# Patient Record
Sex: Female | Born: 1965 | Race: Black or African American | Hispanic: No | Marital: Single | Smoking: Current every day smoker
Health system: Southern US, Community
[De-identification: ages and names within clinical notes are randomized; demographics above are authoritative.]

## PROBLEM LIST (undated history)

## (undated) DIAGNOSIS — B1910 Unspecified viral hepatitis B without hepatic coma: Secondary | ICD-10-CM

## (undated) DIAGNOSIS — I10 Essential (primary) hypertension: Secondary | ICD-10-CM

## (undated) HISTORY — PX: CHOLECYSTECTOMY: SHX55

## (undated) HISTORY — PX: ABDOMINAL HYSTERECTOMY: SHX81

---

## 2014-05-13 ENCOUNTER — Emergency Department (HOSPITAL_BASED_OUTPATIENT_CLINIC_OR_DEPARTMENT_OTHER): Payer: Self-pay

## 2014-05-13 ENCOUNTER — Emergency Department (HOSPITAL_BASED_OUTPATIENT_CLINIC_OR_DEPARTMENT_OTHER)
Admission: EM | Admit: 2014-05-13 | Discharge: 2014-05-13 | Disposition: A | Discharge status: Home / Self Care | Payer: Self-pay | Attending: Emergency Medicine | Admitting: Emergency Medicine

## 2014-05-13 ENCOUNTER — Encounter (HOSPITAL_BASED_OUTPATIENT_CLINIC_OR_DEPARTMENT_OTHER): Payer: Self-pay | Admitting: *Deleted

## 2014-05-13 DIAGNOSIS — J069 Acute upper respiratory infection, unspecified: Secondary | ICD-10-CM | POA: Insufficient documentation

## 2014-05-13 DIAGNOSIS — R110 Nausea: Secondary | ICD-10-CM | POA: Insufficient documentation

## 2014-05-13 DIAGNOSIS — Z90710 Acquired absence of both cervix and uterus: Secondary | ICD-10-CM | POA: Insufficient documentation

## 2014-05-13 DIAGNOSIS — N3000 Acute cystitis without hematuria: Secondary | ICD-10-CM | POA: Insufficient documentation

## 2014-05-13 DIAGNOSIS — Z720 Tobacco use: Secondary | ICD-10-CM | POA: Insufficient documentation

## 2014-05-13 LAB — URINE MICROSCOPIC-ADD ON

## 2014-05-13 LAB — URINALYSIS, ROUTINE W REFLEX MICROSCOPIC
Bilirubin Urine: NEGATIVE
GLUCOSE, UA: NEGATIVE mg/dL
HGB URINE DIPSTICK: NEGATIVE
Ketones, ur: NEGATIVE mg/dL
Nitrite: NEGATIVE
PH: 5.5 (ref 5.0–8.0)
Protein, ur: NEGATIVE mg/dL
SPECIFIC GRAVITY, URINE: 1.018 (ref 1.005–1.030)
UROBILINOGEN UA: 0.2 mg/dL (ref 0.0–1.0)

## 2014-05-13 MED ORDER — ONDANSETRON 4 MG PO TBDP: ORAL_TABLET | ORAL | Status: DC

## 2014-05-13 MED ORDER — CEPHALEXIN 500 MG PO CAPS: 500.0000 mg | ORAL_CAPSULE | Freq: Four times a day (QID) | ORAL | Status: DC

## 2014-05-13 NOTE — ED Provider Notes (Signed)
CSN: [id number]     Arrival date & time 05/13/14  2027 History  This chart was scribed for Theresa MoMatthew Fleur Audino, MD by Gwenyth Oberatherine Macek, ED Scribe. This patient was seen in room MH02/MH02 and the patient's care was started at 10:35 PM.    Chief Complaint  Patient presents with  . Cough  . Back Pain   The history is provided by the patient. No language interpreter was used.    HPI Comments: Theresa Galloway is a 49 y.o. female who presents to the Emergency Department complaining of intermittent productive cough with yellow sputum that started 4-5 days ago. She states subjective fever, lower back pain, urinary frequency and diarrhea as associated symptoms. Pt did not get her flu shot this year. She reports that her son has cold symptoms at home. Pt denies vaginal bleeding, vaginal discharge, abdominal pain, nausea, vomiting, constipation and dysuria as associated symptoms.  History reviewed. No pertinent past medical history. Past Surgical History  Procedure Laterality Date  . Abdominal hysterectomy     No family history on file. History  Substance Use Topics  . Smoking status: Current Every Day Smoker -- 0.50 packs/day    Types: Cigarettes  . Smokeless tobacco: Not on file  . Alcohol Use: No   OB History    No data available     Review of Systems  Constitutional: Positive for fever and chills.  Respiratory: Positive for cough.   Gastrointestinal: Positive for nausea. Negative for vomiting, diarrhea and constipation.  Genitourinary: Positive for frequency. Negative for dysuria, vaginal bleeding and vaginal discharge.  Musculoskeletal: Positive for back pain.  All other systems reviewed and are negative.  Allergies  Review of patient's allergies indicates no known allergies.  Home Medications   Prior to Admission medications   Medication Sig Start Date End Date Taking? Authorizing Provider  cephALEXin (KEFLEX) 500 MG capsule Take 1 capsule (500 mg total) by mouth 4 (four) times daily.  05/13/14   Theresa MoMatthew Baker Kogler, MD  ondansetron (ZOFRAN ODT) 4 MG disintegrating tablet 4mg  ODT q4 hours prn nausea/vomit 05/13/14   Theresa MoMatthew Katelan Hirt, MD   BP 159/108 mmHg  Pulse 81  Temp(Src) 98.3 F (36.8 C) (Oral)  Resp 20  Ht 5\' 3"  (1.6 m)  Wt 160 lb (72.576 kg)  BMI 28.35 kg/m2  SpO2 100% Physical Exam  Constitutional: She is oriented to person, place, and time. She appears well-developed and well-nourished.  HENT:  Head: Normocephalic and atraumatic.  Right Ear: External ear normal.  Left Ear: External ear normal.  Eyes: Conjunctivae and EOM are normal. Pupils are equal, round, and reactive to light.  Neck: Normal range of motion. Neck supple.  Cardiovascular: Normal rate, regular rhythm, normal heart sounds and intact distal pulses.   Pulmonary/Chest: Effort normal and breath sounds normal.  Abdominal: Soft. Bowel sounds are normal. There is no tenderness. There is CVA tenderness (L).  Musculoskeletal: Normal range of motion.  Neurological: She is alert and oriented to person, place, and time.  Skin: Skin is warm and dry.  Vitals reviewed.   ED Course  Procedures (including critical care time) DIAGNOSTIC STUDIES: Oxygen Saturation is 100% on RA, normal by my interpretation.    COORDINATION OF CARE: 10:34 PM Discussed treatment plan with pt at bedside and pt agreed to plan.  Labs Review Labs Reviewed  URINALYSIS, ROUTINE W REFLEX MICROSCOPIC - Abnormal; Notable for the following:    APPearance CLOUDY (*)    Leukocytes, UA MODERATE (*)    All other components  within normal limits  URINE MICROSCOPIC-ADD ON - Abnormal; Notable for the following:    Squamous Epithelial / LPF MANY (*)    Bacteria, UA MANY (*)    All other components within normal limits    Imaging Review Dg Chest 2 View  05/13/2014   CLINICAL DATA:  Cough and lower back pain for 4 days.  EXAM: CHEST  2 VIEW  COMPARISON:  None.  FINDINGS: Cardiac silhouette is upper limits of normal in size. Nipple shadows are  noted projecting over the lower lungs. There is mild peribronchial thickening bilaterally. No segmental airspace consolidation, pulmonary edema, pleural effusion, or pneumothorax is identified. No acute osseous abnormality is identified.  IMPRESSION: Peribronchial thickening, which may reflect bronchitis or reactive airways disease.   Electronically Signed   By: Sebastian Ache   On: 05/13/2014 21:00     EKG Interpretation None      MDM   Final diagnoses:  Upper respiratory infection  Acute cystitis without hematuria    48 y.o. female without pertinent PMH presents with cough, subj fevers, and bil lower lumbar pain x 1 week.  On arrival vitals and physical exam as above.  No dysuria, however pt has had pyelonephritis in the past without urinary symptoms.  She also endorses urinary frequency.  Will treat with keflex (pt uninsured), and fu PRN.  DC home with standard return precautions for pyelo and for URI.   I have reviewed all laboratory and imaging studies if ordered as above  1. Upper respiratory infection   2. Acute cystitis without hematuria          Theresa Mo, MD 05/13/14 2253

## 2014-05-13 NOTE — ED Notes (Signed)
C/o cough, productive green x 1 week   And low back pain x 2 weeks w increased freg,  Denies burning w urination

## 2014-05-13 NOTE — Discharge Instructions (Signed)
Cough, Adult ° A cough is a reflex that helps clear your throat and airways. It can help heal the body or may be a reaction to an irritated airway. A cough may only last 2 or 3 weeks (acute) or may last more than 8 weeks (chronic).  °CAUSES °Acute cough: °· Viral or bacterial infections. °Chronic cough: °· Infections. °· Allergies. °· Asthma. °· Post-nasal drip. °· Smoking. °· Heartburn or acid reflux. °· Some medicines. °· Chronic lung problems (COPD). °· Cancer. °SYMPTOMS  °· Cough. °· Fever. °· Chest pain. °· Increased breathing rate. °· High-pitched whistling sound when breathing (wheezing). °· Colored mucus that you cough up (sputum). °TREATMENT  °· A bacterial cough may be treated with antibiotic medicine. °· A viral cough must run its course and will not respond to antibiotics. °· Your caregiver may recommend other treatments if you have a chronic cough. °HOME CARE INSTRUCTIONS  °· Only take over-the-counter or prescription medicines for pain, discomfort, or fever as directed by your caregiver. Use cough suppressants only as directed by your caregiver. °· Use a cold steam vaporizer or humidifier in your bedroom or home to help loosen secretions. °· Sleep in a semi-upright position if your cough is worse at night. °· Rest as needed. °· Stop smoking if you smoke. °SEEK IMMEDIATE MEDICAL CARE IF:  °· You have pus in your sputum. °· Your cough starts to worsen. °· You cannot control your cough with suppressants and are losing sleep. °· You begin coughing up blood. °· You have difficulty breathing. °· You develop pain which is getting worse or is uncontrolled with medicine. °· You have a fever. °MAKE SURE YOU:  °· Understand these instructions. °· Will watch your condition. °· Will get help right away if you are not doing well or get worse. °Document Released: 09/25/2010 Document Revised: 06/21/2011 Document Reviewed: 09/25/2010 °ExitCare® Patient Information ©2015 [employer]. This information is not intended  to replace advice given to you by your health care provider. Make sure you discuss any questions you have with your health care provider. ° °Urinary Tract Infection °Urinary tract infections (UTIs) can develop anywhere along your urinary tract. Your urinary tract is your body's drainage system for removing wastes and extra water. Your urinary tract includes two kidneys, two ureters, a bladder, and a urethra. Your kidneys are a pair of bean-shaped organs. Each kidney is about the size of your fist. They are located below your ribs, one on each side of your spine. °CAUSES °Infections are caused by microbes, which are microscopic organisms, including fungi, viruses, and bacteria. These organisms are so small that they can only be seen through a microscope. Bacteria are the microbes that most commonly cause UTIs. °SYMPTOMS  °Symptoms of UTIs may vary by age and gender of the patient and by the location of the infection. Symptoms in young women typically include a frequent and intense urge to urinate and a painful, burning feeling in the bladder or urethra during urination. Older women and men are more likely to be tired, shaky, and weak and have muscle aches and abdominal pain. A fever may mean the infection is in your kidneys. Other symptoms of a kidney infection include pain in your back or sides below the ribs, nausea, and vomiting. °DIAGNOSIS °To diagnose a UTI, your caregiver will ask you about your symptoms. Your caregiver also will ask to provide a urine sample. The urine sample will be tested for bacteria and white blood cells. White blood cells are made   by your body to help fight infection. °TREATMENT  °Typically, UTIs can be treated with medication. Because most UTIs are caused by a bacterial infection, they usually can be treated with the use of antibiotics. The choice of antibiotic and length of treatment depend on your symptoms and the type of bacteria causing your infection. °HOME CARE INSTRUCTIONS °· If  you were prescribed antibiotics, take them exactly as your caregiver instructs you. Finish the medication even if you feel better after you have only taken some of the medication. °· Drink enough water and fluids to keep your urine clear or pale yellow. °· Avoid caffeine, tea, and carbonated beverages. They tend to irritate your bladder. °· Empty your bladder often. Avoid holding urine for long periods of time. °· Empty your bladder before and after sexual intercourse. °· After a bowel movement, women should cleanse from front to back. Use each tissue only once. °SEEK MEDICAL CARE IF:  °· You have back pain. °· You develop a fever. °· Your symptoms do not begin to resolve within 3 days. °SEEK IMMEDIATE MEDICAL CARE IF:  °· You have severe back pain or lower abdominal pain. °· You develop chills. °· You have nausea or vomiting. °· You have continued burning or discomfort with urination. °MAKE SURE YOU:  °· Understand these instructions. °· Will watch your condition. °· Will get help right away if you are not doing well or get worse. °Document Released: 01/06/2005 Document Revised: 09/28/2011 Document Reviewed: 05/07/2011 °ExitCare® Patient Information ©2015 [employer]. This information is not intended to replace advice given to you by your health care provider. Make sure you discuss any questions you have with your health care provider. ° °

## 2014-05-13 NOTE — ED Notes (Signed)
Cough, headache and back pain for a week.

## 2014-10-03 ENCOUNTER — Other Ambulatory Visit: Payer: Self-pay

## 2014-10-03 ENCOUNTER — Emergency Department (HOSPITAL_BASED_OUTPATIENT_CLINIC_OR_DEPARTMENT_OTHER): Payer: PRIVATE HEALTH INSURANCE

## 2014-10-03 ENCOUNTER — Encounter (HOSPITAL_BASED_OUTPATIENT_CLINIC_OR_DEPARTMENT_OTHER): Payer: Self-pay | Admitting: *Deleted

## 2014-10-03 ENCOUNTER — Emergency Department (HOSPITAL_BASED_OUTPATIENT_CLINIC_OR_DEPARTMENT_OTHER)
Admission: EM | Admit: 2014-10-03 | Discharge: 2014-10-03 | Disposition: A | Discharge status: Home / Self Care | Payer: PRIVATE HEALTH INSURANCE | Attending: Emergency Medicine | Admitting: Emergency Medicine

## 2014-10-03 DIAGNOSIS — R519 Headache, unspecified: Secondary | ICD-10-CM

## 2014-10-03 DIAGNOSIS — Z720 Tobacco use: Secondary | ICD-10-CM | POA: Insufficient documentation

## 2014-10-03 DIAGNOSIS — R51 Headache: Secondary | ICD-10-CM | POA: Insufficient documentation

## 2014-10-03 DIAGNOSIS — I10 Essential (primary) hypertension: Secondary | ICD-10-CM

## 2014-10-03 DIAGNOSIS — Z79899 Other long term (current) drug therapy: Secondary | ICD-10-CM | POA: Diagnosis not present

## 2014-10-03 DIAGNOSIS — R350 Frequency of micturition: Secondary | ICD-10-CM | POA: Diagnosis not present

## 2014-10-03 DIAGNOSIS — M545 Low back pain: Secondary | ICD-10-CM | POA: Diagnosis not present

## 2014-10-03 DIAGNOSIS — Z8619 Personal history of other infectious and parasitic diseases: Secondary | ICD-10-CM | POA: Insufficient documentation

## 2014-10-03 DIAGNOSIS — R011 Cardiac murmur, unspecified: Secondary | ICD-10-CM | POA: Diagnosis not present

## 2014-10-03 HISTORY — DX: Essential (primary) hypertension: I10

## 2014-10-03 LAB — COMPREHENSIVE METABOLIC PANEL
ALT: 20 U/L (ref 14–54)
AST: 21 U/L (ref 15–41)
Albumin: 4.7 g/dL (ref 3.5–5.0)
Alkaline Phosphatase: 61 U/L (ref 38–126)
Anion gap: 12 (ref 5–15)
BILIRUBIN TOTAL: 0.3 mg/dL (ref 0.3–1.2)
BUN: 17 mg/dL (ref 6–20)
CHLORIDE: 100 mmol/L — AB (ref 101–111)
CO2: 25 mmol/L (ref 22–32)
Calcium: 11.2 mg/dL — ABNORMAL HIGH (ref 8.9–10.3)
Creatinine, Ser: 0.75 mg/dL (ref 0.44–1.00)
GFR calc Af Amer: >60 mL/min (ref >60)
GFR calc non Af Amer: >60 mL/min (ref >60)
Glucose, Bld: 156 mg/dL — ABNORMAL HIGH (ref 65–99)
POTASSIUM: 3.7 mmol/L (ref 3.5–5.1)
Sodium: 137 mmol/L (ref 135–145)
Total Protein: 9 g/dL — ABNORMAL HIGH (ref 6.5–8.1)

## 2014-10-03 LAB — CBC
HCT: 46.6 % — ABNORMAL HIGH (ref 36.0–46.0)
Hemoglobin: 16 g/dL — ABNORMAL HIGH (ref 12.0–15.0)
MCH: 27.2 pg (ref 26.0–34.0)
MCHC: 34.3 g/dL (ref 30.0–36.0)
MCV: 79.3 fL (ref 78.0–100.0)
PLATELETS: 317 10*3/uL (ref 150–400)
RBC: 5.88 MIL/uL — AB (ref 3.87–5.11)
RDW: 13.8 % (ref 11.5–15.5)
WBC: 18.9 10*3/uL — ABNORMAL HIGH (ref 4.0–10.5)

## 2014-10-03 LAB — URINALYSIS, ROUTINE W REFLEX MICROSCOPIC
Bilirubin Urine: NEGATIVE
GLUCOSE, UA: NEGATIVE mg/dL
Hgb urine dipstick: NEGATIVE
Ketones, ur: NEGATIVE mg/dL
LEUKOCYTES UA: NEGATIVE
NITRITE: NEGATIVE
PH: 5.5 (ref 5.0–8.0)
PROTEIN: NEGATIVE mg/dL
Specific Gravity, Urine: 1.011 (ref 1.005–1.030)
Urobilinogen, UA: 0.2 mg/dL (ref 0.0–1.0)

## 2014-10-03 MED ORDER — CLONIDINE HCL 0.1 MG PO TABS
0.2000 mg | ORAL_TABLET | Freq: Once | ORAL | Status: AC
  Administered 2014-10-03: 0.2 mg via ORAL
  Filled 2014-10-03: qty 2

## 2014-10-03 MED ORDER — ONDANSETRON 8 MG PO TBDP
8.0000 mg | ORAL_TABLET | Freq: Once | ORAL | Status: AC
  Administered 2014-10-03: 8 mg via ORAL
  Filled 2014-10-03: qty 1

## 2014-10-03 MED ORDER — LISINOPRIL 10 MG PO TABS: 10.0000 mg | ORAL_TABLET | Freq: Every day | ORAL | Status: DC

## 2014-10-03 MED ORDER — CYCLOBENZAPRINE HCL 5 MG PO TABS: 5.0000 mg | ORAL_TABLET | Freq: Three times a day (TID) | ORAL | Status: DC | PRN

## 2014-10-03 NOTE — Discharge Instructions (Signed)
°Emergency Department Resource Guide °1) Find a Doctor and Pay Out of Pocket °Although you won't have to find out who is covered by your insurance plan, it is a good idea to ask around and get recommendations. You will then need to call the office and see if the doctor you have chosen will accept you as a new patient and what types of options they offer for patients who are self-pay. Some doctors offer discounts or will set up payment plans for their patients who do not have insurance, but you will need to ask so you aren't surprised when you get to your appointment. ° °2) Contact Your Local Health Department °Not all health departments have doctors that can see patients for sick visits, but many do, so it is worth a call to see if yours does. If you don't know where your local health department is, you can check in your phone book. The CDC also has a tool to help you locate your state's health department, and many state websites also have listings of all of their local health departments. ° °3) Find a Walk-in Clinic °If your illness is not likely to be very severe or complicated, you may want to try a walk in clinic. These are popping up all over the country in pharmacies, drugstores, and shopping centers. They're usually staffed by nurse practitioners or physician assistants that have been trained to treat common illnesses and complaints. They're usually fairly quick and inexpensive. However, if you have serious medical issues or chronic medical problems, these are probably not your best option. ° °No Primary Care Doctor: °- Call [facility] at  832-8000 - they can help you locate a primary care doctor that  accepts your insurance, provides certain services, etc. °- Physician Referral Service- 1-800-533-3463 ° °Chronic Pain Problems: °Organization         Address  Phone   Notes  °[facility]  (336) 297-2271 Patients need to be referred by their primary care doctor.  ° °Medication  Assistance: °Organization         Address  Phone   Notes  °[place] [facility] [address] °[place], [place] [address] (336) 641-8030 --Must be a resident of [place] °-- Must have NO insurance coverage whatsoever (no Medicaid/ Medicare, etc.) °-- The pt. MUST have a primary care doctor that directs their care regularly and follows them in the community °  °[facility]  (866) 331-1348   °[employer]  (888) 892-1162   ° °Agencies that provide inexpensive medical care: °Organization         Address  Phone   Notes  °[facility]  (336) 832-8035   °[facility]    (336) 832-7272   °[facility] [address] °[place], [place] [address] (336) 832-4777   °[facility] [address], °[place] (336) 271-4999   °[facility]    (336) 373-0678   °[facility]    (336) 272-1050   °[facility] ° [address], [place] Phone:  (336) 832-4444, Fax:  (336) 832-4440 Hours of Operation:  9 am - 6 pm, M-F.  Also accepts Medicaid/Medicare and self-pay.  °[facility] ° [address], [place] Phone: (336) 832-3150, Fax: (336) 832-3151. Hours of Operation:  8:30 am - 5:30 pm, M-F.  Also accepts Medicaid and self-pay.  °[facility] [place] [address], [place] Phone: (336) 878-6027   °[facility] [address], [place], [place] (336)723-1848, Ext. 123 Mondays & Thursdays: 7-9 AM.  First 15 patients are seen on a first come, first serve basis. °  ° °Medicaid-accepting [place] Providers: ° °Organization         Address  Phone   Notes  °[facility] [address], [place] (336) 641-2100 Also accepts self-pay patients.  °[facility] [address], [place] ° (336) 856-9996   °[facility] [address], [place]  (336) 288-8857   °[facility] [address] [place] [address], [place] (336) 299-7000   °Veita Bland [address], [place]  ° (336) 373-1557 Only accepts [place] Access Medicaid patients after they have their name applied to their card.  ° °Self-Pay (no insurance) in [place]: ° °Organization         Address  Phone   Notes  °Sickle Cell Patients, [facility] [address], [place] (336) 832-1970   °[facility] [address], [place] (336) 832-4400   °[facility] ° [address], [place] (336) 992-4800   °[facility]/Dr. Osei-Bonsu ° [address] [place] [address], [place] or [address], [place] (336) [phone] Phone number for both [place] and [place] locations is the same.  °[facility] [address], [place] (336) 299-0000   °[facility] [address] [place] [address], [place] or [address] (336) 852-7530 °(336) 878-2260   °[facility] [address], [place] (336) 350-1642, phone; (336) 294-5005, fax Sees patients 1st and 3rd Saturday of every month.  Must not qualify for public or private insurance (i.e. Medicaid, Medicare, [place] Health Choice, Veterans' Benefits) • Household income should be no more than 200% of the poverty level •The clinic cannot treat you if you are pregnant or think you are pregnant • Sexually transmitted diseases are not treated at the clinic.  ° ° °Dental Care: °Organization         Address  Phone  Notes  °[place] [facility] [address], [place] (336) 641-6152 Accepts children up to age 21 who are enrolled in Medicaid or [place] Health Choice; pregnant women with a Medicaid card; and children who have applied for Medicaid or [place] Health Choice, but were declined, whose parents can pay a reduced fee at time of service.  °[place]  [facility] [place]  [address], [place] (336) 641-7733 Accepts children up to age 21 who are enrolled in Medicaid or [place] Health Choice; pregnant women with a Medicaid card; and children who have applied for Medicaid or [place] Health Choice, but were declined, whose parents can pay a reduced fee at time of service.  °[facility] ° [address], [place] (336) 641-4533 Patients are seen by appointment only. Walk-ins are not accepted. [facility] will see patients 18 years of age and older. °Monday - Tuesday (8am-5pm) °Most Wednesdays (8:30-5pm) °$30 per visit, cash only  °[facility] ° [address], [place] (336) 641-4533 Patients are seen by appointment only. Walk-ins are not accepted. [facility] will see patients 18 years of age and older. °One   Wednesday Evening (Monthly: Volunteer Based).  $30 per visit, cash only  °[facility]  (919) 537-3737 for adults; Children under age 4, call Graduate Pediatric Dentistry at ([phone]) [phone]. Children aged 4-14, please call (919) [phone] to request a pediatric application. ° Dental services are provided in all areas of dental care including fillings, crowns and bridges, complete and partial dentures, implants, gum treatment, root canals, and extractions. Preventive care is also provided. Treatment is provided to both adults and children. °Patients are selected via a lottery and there is often a waiting list. °  °[facility] [address], °[place] ° (336) 763-8833 [email or web address] °  °[facility] [address], [place], [place] (336)723-1848, Ext. 123 Second and Fourth Thursday of each month, opens at 6:30 AM; Clinic ends at 9 AM.  Patients are seen on a first-come first-served basis, and a limited number are seen during each clinic.  ° °[facility] ° [address], [place], [place] (336) 723-7904    Eligibility Requirements °You must have lived in [place], [place], or [place] counties for at least the last three months. °  You cannot be eligible for state or federal sponsored healthcare insurance, including Veterans Administration, Medicaid, or Medicare. °  You generally cannot be eligible for healthcare insurance through your employer.  °  How to apply: °Eligibility screenings are held every Tuesday and Wednesday afternoon from 1:00 pm until 4:00 pm. You do not need an appointment for the interview!  °[facility] [address], [place], [place] 336-631-2330   °[facility] [facility]  336-342-8273   °[place]  336-703-3100   ° [facility]  336-570-6415   ° °Behavioral Health Resources in the Community: °Intensive Outpatient Programs °Organization         Address  Phone  Notes  °[facility] [address], [place], [place] 336-878-6098   °[facility] [address], [place], [place] 336-832-9800   °[facility] [address], [place], [place] ° 336-882-2125   °[facility] [address],  °[place], [place] 1-800-853-5163 or 336-641-4981   °Substance Abuse Resources °Organization         Address  Phone  Notes  °[facility]  336-882-2125   °[facility]  336-784-9470   °[facility]  336-285-9073   °[facility]  336-845-3988   °Residential & Outpatient Substance Abuse Program  1-800-659-3381   °Psychological Services °Organization         Address  Phone  Notes  °[facility]  336- 832-9600   °[facility]  336- 378-7881   °[facility] [address], [place] 1-800-853-5163 or 336-641-4981   ° °Mobile Crisis Teams °Organization         Address  Phone  Notes  °[facility], Mobile Crisis Care Unit  1-877-626-1772   °Assertive °[facility] ° [address]  [place], [place] 336-834-9664   °Sharon DeEsch [address] °[place] 336-554-5454   ° °Self-Help/Support Groups °Organization         Address  Phone             Notes  °[facility] [place] - variety of support groups  336- 373-1402 Call for more information  °Narcotics Anonymous (NA), [facility] [address], °[place]  2 meetings at this location  ° °  Residential Treatment Programs °Organization         Address  Phone  Notes  °[facility] [address],    °[place]  1-866-801-8205   °[facility] ° [address], [place], [place] 704-293-8524   °[facility] [address], [place] 336-845-3988 Admissions: 8am-3pm M-F  °[facility] [address]    °[place], [place] 336-841-1104   °[facility] [address], [place], [place] 336-379-7146   °[facility] [address]  °[place], [place] 336-285-9073   °[facility] Intensive Outpatient [address], [place], [place] 336-852-3033   °[facility] [address]  °[place], [place] 1-877-615-2722 or 336-784-9470   °[facility] [address] [place], [place] 336-227-7417 Accepts Medicaid  °[facility] [address]  °[place] 1-800-659-3381 Substance Abuse/Addiction Treatment  ° °[place] Behavioral Health Resources °Organization         Address  Phone  Notes  °[facility]  (888) 581-9988   °Julie Brannon, PhD [address] [place], [place]   (336) 349-5553 or (336) 951-0000   °[facility]   [address] °[place], [place] (336) 349-4454   °[facility] [address], [place], [place] (336) 342-8316 Insurance/Medicaid/sponsorship through [facility]  °[facility] [address]                                    [place], [place] (336) 342-8316 Therapy/tele-psych/case    °[facility] [address]  ° [place],  (336) 349-2233    °Dr. Arfeen  (336) 349-4544   °[facility] [place]  [employer] [place] [facility] 1) [address], [place] °2) [address], [place] °3)  [address], [place] (336) 349-3220 °(336) 342-7768 ° °(336) 342-8140   °[place] [facility] (336) 342-1394 or (336) 342-3537 (After Hours)    ° ° °

## 2014-10-03 NOTE — ED Notes (Signed)
Pt amb to room 6 with slow, steady gait in nad. Pt reports low back pain x 1 month, denies any injury or trauma, states she is on her feet all day at work and feels that this is hurting her back. Pt also states [facility], her bp was "too high, 200's over a hundred" for which they prescribed hctz 25mg , pt states she has been taking every day since then, seen at [facility] clinic yesterday and her bp was still high. Pt states she can't work and they would not write her a work note yesterday. Pt states her back pain "is killing me", she was given rx for vicodin and prednisone at [facility] on Monday.

## 2014-10-03 NOTE — ED Provider Notes (Signed)
CSN: [id number]     Arrival date & time 10/03/14  3888 History   None    Chief Complaint  Patient presents with  . Hypertension     (Consider location/radiation/quality/duration/timing/severity/associated sxs/prior Treatment) Patient is a 49 y.o. female presenting with hypertension. The history is provided by the patient.  Hypertension This is a chronic problem. The current episode started more than 1 month ago. The problem occurs daily. Associated symptoms include headaches and nausea. Pertinent negatives include no abdominal pain, chest pain, chills, fever, numbness, rash, vomiting or weakness.    Theresa Galloway is a 49 yo F PMH HTN, Hep C and former substance abuse with crack cocaine that is p/w HA, low back pain and elevated BP. Low back pain that has been present for months. It has been intermittent but becoming worse as of late. The pain does have some radiation in her left buttock. No prior injury to her back. Pain is 10/10 and achy, sharp in nature.  Denies any saddle anesthesia, urinary or bowel incontinence.  She has been given prednisone x 2 and vicodin with no improvement. She has had CT of her back at [facility] ER this past Monday and negative.  Pain is worse with standing and lying flat.  She was given rx for Vicodin and prednisone at [facility] on Monday.   She started having a HA since Sunday. It is throbbing in nature and generalized distribution. Denies any photophobia, phonophobia, blurry vision or double vision. Hx of HA but none this severe.  No family hx of migraines. Denies any fevers, chills but does have night sweats.    She was also found to have elevated BP while she was in the ED. She was started on HCTZ 25 mg and has been taking it since Monday. She has a history of HTN but has been noncompliant with medications. She hasn't been to a doctor in a long time.  She denies any dysuria, chest pain, SOB, diarrhea, constipation, abdominal pain or vaginal discharge. History of crack  cocaine use but hasn't use in 8 months.    Past Medical History  Diagnosis Date  . Hypertension    Past Surgical History  Procedure Laterality Date  . Abdominal hysterectomy     History reviewed. No pertinent family history. History  Substance Use Topics  . Smoking status: Current Every Day Smoker -- 0.50 packs/day    Types: Cigarettes  . Smokeless tobacco: Not on file  . Alcohol Use: No   OB History    No data available     Review of Systems  Constitutional: Negative for fever and chills.  Eyes: Negative for photophobia.  Respiratory: Negative for shortness of breath.   Cardiovascular: Negative for chest pain.  Gastrointestinal: Positive for nausea. Negative for vomiting, abdominal pain, diarrhea and constipation.  Genitourinary: Positive for frequency. Negative for dysuria.  Musculoskeletal: Positive for back pain.  Skin: Negative for rash.  Neurological: Positive for headaches. Negative for weakness and numbness.      Allergies  Review of patient's allergies indicates no known allergies.  Home Medications   Prior to Admission medications   Medication Sig Start Date End Date Taking? Authorizing Provider  hydrochlorothiazide (HYDRODIURIL) 25 MG tablet Take 25 mg by mouth daily.   Yes Historical Provider, MD  HYDROcodone-acetaminophen (NORCO/VICODIN) 5-325 MG per tablet Take 1 tablet by mouth every 6 (six) hours as needed for moderate pain.   Yes Historical Provider, MD  PREDNISONE PO Take by mouth.   Yes Historical  Provider, MD  cyclobenzaprine (FLEXERIL) 5 MG tablet Take 1 tablet (5 mg total) by mouth 3 (three) times daily as needed for muscle spasms. 10/03/14   Myra Rude, MD  lisinopril (PRINIVIL,ZESTRIL) 10 MG tablet Take 1 tablet (10 mg total) by mouth daily. 10/03/14   Myra Rude, MD   BP 186/98 mmHg  Pulse 78  Temp(Src) 98.6 F (37 C) (Oral)  Resp 16  Ht  (1.6 m)  Wt 170 lb (77.111 kg)  BMI 30.12 kg/m2  SpO2 100% Physical Exam    Constitutional: She is oriented to person, place, and time. She appears well-developed and well-nourished.  HENT:  Head: Normocephalic and atraumatic.  Eyes: Conjunctivae and EOM are normal. Pupils are equal, round, and reactive to light.  Neck: Normal range of motion.  Cardiovascular: Normal rate, regular rhythm and intact distal pulses.   Murmur heard. Pulmonary/Chest: Effort normal and breath sounds normal. No respiratory distress. She has no wheezes. She has no rales.  Abdominal: Soft. Bowel sounds are normal. She exhibits no distension. There is no tenderness. There is no rebound.  Musculoskeletal:       Back:  Back: no erythema or ecchymosis  TTP along lumbar back but no spinal tenderness Normal internal and external rotation but worsening of back pain on left.  Straight leg with pos at 60 degrees  Neg FABER and FADIR  Neurovascularly intact    Neurological: She is alert and oriented to person, place, and time. She has normal strength. No cranial nerve deficit or sensory deficit.  Normal finger to nose testing  Normal rapid alternating hand movements   Skin: Skin is warm. No rash noted.    ED Course  Procedures (including critical care time) Labs Review Labs Reviewed  COMPREHENSIVE METABOLIC PANEL - Abnormal; Notable for the following:    Chloride 100 (*)    Glucose, Bld 156 (*)    Calcium 11.2 (*)    Total Protein 9.0 (*)    All other components within normal limits  CBC - Abnormal; Notable for the following:    WBC 18.9 (*)    RBC 5.88 (*)    Hemoglobin 16.0 (*)    HCT 46.6 (*)    All other components within normal limits  URINALYSIS, ROUTINE W REFLEX MICROSCOPIC (NOT AT [facility])    Imaging Review Ct Head Wo Contrast  10/03/2014   CLINICAL DATA:  Hypertension.  Headache for 2 days.  EXAM: CT HEAD WITHOUT CONTRAST  TECHNIQUE: Contiguous axial images were obtained from the base of the skull through the vertex without intravenous contrast.  COMPARISON:  None.   FINDINGS: No acute cortical infarct, hemorrhage, or mass lesion is present. The ventricles are of normal size. No significant extra-axial fluid collection is evident. The paranasal sinuses and mastoid air cells are clear. The calvarium is intact.  IMPRESSION: Negative CT of the head.   Electronically Signed   By: Marin Roberts M.D.   On: 10/03/2014 08:02     EKG Interpretation   Date/Time:  Thursday October 03 2014 08:10:46 EDT Ventricular Rate:  69 PR Interval:  178 QRS Duration: 86 QT Interval:  446 QTC Calculation: 477 R Axis:   -58 Text Interpretation:  Normal sinus rhythm Left axis deviation Septal  infarct , age undetermined Abnormal ECG Confirmed by DELO  MD, DOUGLAS  [phone]) on 10/03/2014 8:12:27 AM      Medications  cloNIDine (CATAPRES) tablet 0.2 mg (0.2 mg Oral Given 10/03/14 0801)  ondansetron (ZOFRAN-ODT) disintegrating  tablet 8 mg (8 mg Oral Given 10/03/14 0809)     MDM   Final diagnoses:  Bilateral low back pain, with sciatica presence unspecified  Nonintractable headache, unspecified chronicity pattern, unspecified headache type  Essential hypertension   Theresa Galloway is a 49 yo F p/w low back pain, HTN and HA.  Low back pain most likely 2/2 muscle strain. No saddle anesthesia, weakness, or urinary or bladder incontinence. She has been given Vicodin and prednisone on Monday from [facility] ED. Flexeril rx given for back pain.  Most likely the prednisone is causing the leukocytosis. Work note given for today.   HTN improved with clonicine administration. Prednisone could also be causing an elevation of her blood pressure. She has taken her HCTZ this am and will add lisinopril 10 mg to her regimen. HA sounds tension in nature. Advised to take tylenol as needed.   Myra Rude, MD PGY-2, [facility] 10/03/2014, 9:46 AM      Myra Rude, MD 10/03/14 1610  Geoffery Lyons, MD 10/03/14 [phone]

## 2015-12-16 ENCOUNTER — Encounter (HOSPITAL_BASED_OUTPATIENT_CLINIC_OR_DEPARTMENT_OTHER): Payer: Self-pay | Admitting: Emergency Medicine

## 2015-12-16 DIAGNOSIS — F17210 Nicotine dependence, cigarettes, uncomplicated: Secondary | ICD-10-CM | POA: Diagnosis not present

## 2015-12-16 DIAGNOSIS — B349 Viral infection, unspecified: Secondary | ICD-10-CM | POA: Insufficient documentation

## 2015-12-16 DIAGNOSIS — I10 Essential (primary) hypertension: Secondary | ICD-10-CM | POA: Insufficient documentation

## 2015-12-16 DIAGNOSIS — R05 Cough: Secondary | ICD-10-CM | POA: Diagnosis present

## 2015-12-16 LAB — URINALYSIS, ROUTINE W REFLEX MICROSCOPIC
Bilirubin Urine: NEGATIVE
Glucose, UA: NEGATIVE mg/dL
HGB URINE DIPSTICK: NEGATIVE
Ketones, ur: 15 mg/dL — AB
Leukocytes, UA: NEGATIVE
NITRITE: NEGATIVE
PROTEIN: 100 mg/dL — AB
Specific Gravity, Urine: 1.017 (ref 1.005–1.030)
pH: 8.5 — ABNORMAL HIGH (ref 5.0–8.0)

## 2015-12-16 LAB — URINE MICROSCOPIC-ADD ON: WBC UA: NONE SEEN WBC/hpf (ref 0–5)

## 2015-12-16 NOTE — ED Triage Notes (Signed)
Pt c/o HA, vomiting, cough, congestion and back pain.

## 2015-12-17 ENCOUNTER — Emergency Department (HOSPITAL_BASED_OUTPATIENT_CLINIC_OR_DEPARTMENT_OTHER): Payer: Commercial Managed Care - PPO

## 2015-12-17 ENCOUNTER — Emergency Department (HOSPITAL_BASED_OUTPATIENT_CLINIC_OR_DEPARTMENT_OTHER)
Admission: EM | Admit: 2015-12-17 | Discharge: 2015-12-17 | Disposition: A | Discharge status: Home / Self Care | Payer: Commercial Managed Care - PPO | Attending: Emergency Medicine | Admitting: Emergency Medicine

## 2015-12-17 DIAGNOSIS — B349 Viral infection, unspecified: Secondary | ICD-10-CM

## 2015-12-17 LAB — CBC WITH DIFFERENTIAL/PLATELET
Basophils Absolute: 0.1 10*3/uL (ref 0.0–0.1)
Basophils Relative: 0 %
EOS ABS: 0.1 10*3/uL (ref 0.0–0.7)
EOS PCT: 0 %
HCT: 42.3 % (ref 36.0–46.0)
Hemoglobin: 14.5 g/dL (ref 12.0–15.0)
LYMPHS ABS: 1.8 10*3/uL (ref 0.7–4.0)
LYMPHS PCT: 8 %
MCH: 27.4 pg (ref 26.0–34.0)
MCHC: 34.3 g/dL (ref 30.0–36.0)
MCV: 79.8 fL (ref 78.0–100.0)
MONOS PCT: 4 %
Monocytes Absolute: 0.9 10*3/uL (ref 0.1–1.0)
Neutro Abs: 19 10*3/uL — ABNORMAL HIGH (ref 1.7–7.7)
Neutrophils Relative %: 88 %
PLATELETS: 316 10*3/uL (ref 150–400)
RBC: 5.3 MIL/uL — ABNORMAL HIGH (ref 3.87–5.11)
RDW: 15.1 % (ref 11.5–15.5)
WBC: 21.8 10*3/uL — ABNORMAL HIGH (ref 4.0–10.5)

## 2015-12-17 LAB — BASIC METABOLIC PANEL
Anion gap: 12 (ref 5–15)
BUN: 7 mg/dL (ref 6–20)
CO2: 23 mmol/L (ref 22–32)
CREATININE: 0.65 mg/dL (ref 0.44–1.00)
Calcium: 9.9 mg/dL (ref 8.9–10.3)
Chloride: 99 mmol/L — ABNORMAL LOW (ref 101–111)
GFR calc Af Amer: >60 mL/min (ref >60)
GFR calc non Af Amer: >60 mL/min (ref >60)
GLUCOSE: 169 mg/dL — AB (ref 65–99)
Potassium: 3.2 mmol/L — ABNORMAL LOW (ref 3.5–5.1)
SODIUM: 134 mmol/L — AB (ref 135–145)

## 2015-12-17 MED ORDER — ONDANSETRON HCL 4 MG/2ML IJ SOLN
4.0000 mg | Freq: Once | INTRAMUSCULAR | Status: AC
  Administered 2015-12-17: 4 mg via INTRAVENOUS
  Filled 2015-12-17: qty 2

## 2015-12-17 MED ORDER — KETOROLAC TROMETHAMINE 15 MG/ML IJ SOLN
INTRAMUSCULAR | Status: AC
  Filled 2015-12-17: qty 1

## 2015-12-17 MED ORDER — ALBUTEROL SULFATE HFA 108 (90 BASE) MCG/ACT IN AERS
2.0000 | INHALATION_SPRAY | RESPIRATORY_TRACT | Status: DC | PRN
  Administered 2015-12-17: 2 via RESPIRATORY_TRACT
  Filled 2015-12-17: qty 6.7

## 2015-12-17 MED ORDER — SODIUM CHLORIDE 0.9 % IV SOLN
Freq: Once | INTRAVENOUS | Status: AC
  Administered 2015-12-17: 03:00:00 via INTRAVENOUS

## 2015-12-17 MED ORDER — ONDANSETRON 8 MG PO TBDP: 8.0000 mg | ORAL_TABLET | Freq: Three times a day (TID) | ORAL | 0 refills | Status: DC | PRN

## 2015-12-17 MED ORDER — DOXYCYCLINE HYCLATE 100 MG PO TABS: 100.0000 mg | ORAL_TABLET | Freq: Two times a day (BID) | ORAL | Status: DC

## 2015-12-17 MED ORDER — DOXYCYCLINE HYCLATE 100 MG PO CAPS: 100.0000 mg | ORAL_CAPSULE | Freq: Two times a day (BID) | ORAL | 0 refills | Status: DC

## 2015-12-17 MED ORDER — KETOROLAC TROMETHAMINE 15 MG/ML IJ SOLN
15.0000 mg | Freq: Once | INTRAMUSCULAR | Status: AC
Start: 2015-12-17 — End: 2015-12-17
  Administered 2015-12-17: 15 mg via INTRAVENOUS

## 2015-12-17 MED ORDER — IPRATROPIUM-ALBUTEROL 0.5-2.5 (3) MG/3ML IN SOLN
3.0000 mL | RESPIRATORY_TRACT | Status: DC
  Administered 2015-12-17: 3 mL via RESPIRATORY_TRACT
  Filled 2015-12-17: qty 3

## 2015-12-17 NOTE — ED Provider Notes (Signed)
[facility] Provider Note   CSN: [id number] Arrival date & time: 12/16/15  2258     History   Chief Complaint Chief Complaint  Patient presents with  . URI    HPI Theresa Galloway is a 50 y.o. female who has had a respiratory illness for the past 4 days. Specifically she has had subjective fevers, cough, nasal congestion, headache, body aches, malaise, low back pain, shortness of breath and chest soreness. Yesterday she began vomiting and has vomited multiple times; vomiting is triggered by attempts to drink. She has been taking multiple over-the-counter medications including Alka-Seltzer and Tylenol PM without relief. She describes her symptoms as moderate to severe. She denies diarrhea.  HPI  Past Medical History:  Diagnosis Date  . Hypertension     There are no active problems to display for this patient.   Past Surgical History:  Procedure Laterality Date  . ABDOMINAL HYSTERECTOMY      OB History    No data available       Home Medications    Prior to Admission medications   Medication Sig Start Date End Date Taking? Authorizing Provider  doxycycline (VIBRAMYCIN) 100 MG capsule Take 1 capsule (100 mg total) by mouth 2 (two) times daily. One po bid x 7 days 12/17/15   Paula LibraJohn Chistian Kasler, MD  hydrochlorothiazide (HYDRODIURIL) 25 MG tablet Take 25 mg by mouth daily.    Historical Provider, MD  lisinopril (PRINIVIL,ZESTRIL) 10 MG tablet Take 1 tablet (10 mg total) by mouth daily. 10/03/14   Myra RudeJeremy E Schmitz, MD  ondansetron (ZOFRAN ODT) 8 MG disintegrating tablet Take 1 tablet (8 mg total) by mouth every 8 (eight) hours as needed for nausea or vomiting. 12/17/15   Paula LibraJohn Dornell Grasmick, MD    Family History No family history on file.  Social History Social History  Substance Use Topics  . Smoking status: Current Every Day Smoker    Packs/day: 0.50    Types: Cigarettes  . Smokeless tobacco: Never Used  . Alcohol use No     Allergies   Review of patient's allergies  indicates no known allergies.   Review of Systems Review of Systems  All other systems reviewed and are negative.   Physical Exam Updated Vital Signs BP (!) 117/113 (BP Location: Right Arm)   Pulse 80   Temp 98.4 F (36.9 C)   Resp 18   Ht 5\' 3"  (1.6 m)   Wt 160 lb (72.6 kg)   SpO2 100%   BMI 28.34 kg/m   Physical Exam General: Well-developed, well-nourished female in no acute distress; appearance consistent with age of record HENT: normocephalic; atraumatic; pharynx normal Eyes: pupils equal, round and reactive to light; extraocular muscles intact Neck: supple Heart: regular rate and rhythm Lungs: clear to auscultation bilaterally; rattly cough Abdomen: soft; nondistended; mild diffuse tenderness; no masses or hepatosplenomegaly; bowel sounds present Back: Bilateral paralumbar tenderness Extremities: No deformity; full range of motion; pulses normal Neurologic: Awake, alert and oriented; motor function intact in all extremities and symmetric; no facial droop Skin: Warm and dry Psychiatric: Flat affect    ED Treatments / Results   Nursing notes and vitals signs, including pulse oximetry, reviewed.  Summary of this visit's results, reviewed by myself:  Labs:  Results for orders placed or performed during the hospital encounter of 12/17/15 (from the past 24 hour(s))  Urinalysis, Routine w reflex microscopic ([facility])     Status: Abnormal   Collection Time: 12/16/15 11:02 PM  Result  Value Ref Range   Color, Urine YELLOW YELLOW   APPearance CLEAR CLEAR   Specific Gravity, Urine 1.017 1.005 - 1.030   pH 8.5 (H) 5.0 - 8.0   Glucose, UA NEGATIVE NEGATIVE mg/dL   Hgb urine dipstick NEGATIVE NEGATIVE   Bilirubin Urine NEGATIVE NEGATIVE   Ketones, ur 15 (A) NEGATIVE mg/dL   Protein, ur 811 (A) NEGATIVE mg/dL   Nitrite NEGATIVE NEGATIVE   Leukocytes, UA NEGATIVE NEGATIVE  Urine microscopic-add on     Status: Abnormal   Collection Time: 12/16/15 11:02 PM  Result  Value Ref Range   Squamous Epithelial / LPF 0-5 (A) NONE SEEN   WBC, UA NONE SEEN 0 - 5 WBC/hpf   RBC / HPF 0-5 0 - 5 RBC/hpf   Bacteria, UA RARE (A) NONE SEEN  CBC with Differential     Status: Abnormal   Collection Time: 12/17/15  2:35 AM  Result Value Ref Range   WBC 21.8 (H) 4.0 - 10.5 K/uL   RBC 5.30 (H) 3.87 - 5.11 MIL/uL   Hemoglobin 14.5 12.0 - 15.0 g/dL   HCT 91.4 78.2 - 95.6 %   MCV 79.8 78.0 - 100.0 fL   MCH 27.4 26.0 - 34.0 pg   MCHC 34.3 30.0 - 36.0 g/dL   RDW 21.3 08.6 - 57.8 %   Platelets 316 150 - 400 K/uL   Neutrophils Relative % 88 %   Neutro Abs 19.0 (H) 1.7 - 7.7 K/uL   Lymphocytes Relative 8 %   Lymphs Abs 1.8 0.7 - 4.0 K/uL   Monocytes Relative 4 %   Monocytes Absolute 0.9 0.1 - 1.0 K/uL   Eosinophils Relative 0 %   Eosinophils Absolute 0.1 0.0 - 0.7 K/uL   Basophils Relative 0 %   Basophils Absolute 0.1 0.0 - 0.1 K/uL  Basic metabolic panel     Status: Abnormal   Collection Time: 12/17/15  2:35 AM  Result Value Ref Range   Sodium 134 (L) 135 - 145 mmol/L   Potassium 3.2 (L) 3.5 - 5.1 mmol/L   Chloride 99 (L) 101 - 111 mmol/L   CO2 23 22 - 32 mmol/L   Glucose, Bld 169 (H) 65 - 99 mg/dL   BUN 7 6 - 20 mg/dL   Creatinine, Ser 4.69 0.44 - 1.00 mg/dL   Calcium 9.9 8.9 - 62.9 mg/dL   GFR calc non Af Amer >60 >60 mL/min   GFR calc Af Amer >60 >60 mL/min   Anion gap 12 5 - 15    Imaging Studies: Dg Chest 2 View  Result Date: 12/17/2015 CLINICAL DATA:  Acute onset of cough and congestion. Initial encounter. EXAM: CHEST  2 VIEW COMPARISON:  Chest radiograph from 05/13/2014 FINDINGS: The lungs are well-aerated. Minimal bibasilar atelectasis is noted. There is no evidence of pleural effusion or pneumothorax. The heart is borderline normal in size. No acute osseous abnormalities are seen. IMPRESSION: Minimal bibasilar atelectasis noted.  Lungs otherwise clear. Electronically Signed   By: Roanna Raider M.D.   On: 12/17/2015 03:32   4:20 AM Patient drinking  fluids without emesis after IV Zofran. Breathing cough improved with DuoNeb treatment. Will start on an antibiotic for acute bronchitis and out of concern for possible nascent pneumonia.   Procedures (including critical care time)  Final Clinical Impressions(s) / ED Diagnoses   Final diagnoses:  Viral illness       Paula Libra, MD 12/17/15 [phone]

## 2015-12-17 NOTE — ED Notes (Addendum)
C/o n/v x 1 day, ha congestion, body aches, back pain x 5 days

## 2016-05-21 ENCOUNTER — Observation Stay (HOSPITAL_BASED_OUTPATIENT_CLINIC_OR_DEPARTMENT_OTHER)
Admission: EM | Admit: 2016-05-21 | Discharge: 2016-05-24 | Disposition: A | Discharge status: Home / Self Care | Payer: Commercial Managed Care - PPO | Attending: Internal Medicine | Admitting: Internal Medicine

## 2016-05-21 ENCOUNTER — Emergency Department (HOSPITAL_BASED_OUTPATIENT_CLINIC_OR_DEPARTMENT_OTHER): Payer: Commercial Managed Care - PPO

## 2016-05-21 ENCOUNTER — Encounter (HOSPITAL_BASED_OUTPATIENT_CLINIC_OR_DEPARTMENT_OTHER): Payer: Self-pay | Admitting: *Deleted

## 2016-05-21 DIAGNOSIS — R509 Fever, unspecified: Secondary | ICD-10-CM | POA: Insufficient documentation

## 2016-05-21 DIAGNOSIS — I119 Hypertensive heart disease without heart failure: Secondary | ICD-10-CM | POA: Diagnosis not present

## 2016-05-21 DIAGNOSIS — R1115 Cyclical vomiting syndrome unrelated to migraine: Secondary | ICD-10-CM

## 2016-05-21 DIAGNOSIS — R1033 Periumbilical pain: Secondary | ICD-10-CM

## 2016-05-21 DIAGNOSIS — R0781 Pleurodynia: Secondary | ICD-10-CM | POA: Diagnosis not present

## 2016-05-21 DIAGNOSIS — F1910 Other psychoactive substance abuse, uncomplicated: Secondary | ICD-10-CM

## 2016-05-21 DIAGNOSIS — R51 Headache: Secondary | ICD-10-CM | POA: Insufficient documentation

## 2016-05-21 DIAGNOSIS — J111 Influenza due to unidentified influenza virus with other respiratory manifestations: Secondary | ICD-10-CM

## 2016-05-21 DIAGNOSIS — R112 Nausea with vomiting, unspecified: Principal | ICD-10-CM | POA: Insufficient documentation

## 2016-05-21 DIAGNOSIS — Z8719 Personal history of other diseases of the digestive system: Secondary | ICD-10-CM | POA: Diagnosis not present

## 2016-05-21 DIAGNOSIS — R531 Weakness: Secondary | ICD-10-CM | POA: Diagnosis not present

## 2016-05-21 DIAGNOSIS — M545 Low back pain: Secondary | ICD-10-CM | POA: Insufficient documentation

## 2016-05-21 DIAGNOSIS — Z8619 Personal history of other infectious and parasitic diseases: Secondary | ICD-10-CM | POA: Insufficient documentation

## 2016-05-21 DIAGNOSIS — R05 Cough: Secondary | ICD-10-CM | POA: Insufficient documentation

## 2016-05-21 DIAGNOSIS — E876 Hypokalemia: Secondary | ICD-10-CM | POA: Insufficient documentation

## 2016-05-21 DIAGNOSIS — R079 Chest pain, unspecified: Secondary | ICD-10-CM | POA: Diagnosis present

## 2016-05-21 DIAGNOSIS — Z7982 Long term (current) use of aspirin: Secondary | ICD-10-CM | POA: Insufficient documentation

## 2016-05-21 DIAGNOSIS — F1110 Opioid abuse, uncomplicated: Secondary | ICD-10-CM | POA: Insufficient documentation

## 2016-05-21 DIAGNOSIS — I10 Essential (primary) hypertension: Secondary | ICD-10-CM

## 2016-05-21 DIAGNOSIS — F1410 Cocaine abuse, uncomplicated: Secondary | ICD-10-CM | POA: Diagnosis not present

## 2016-05-21 DIAGNOSIS — R059 Cough, unspecified: Secondary | ICD-10-CM | POA: Diagnosis present

## 2016-05-21 DIAGNOSIS — F17210 Nicotine dependence, cigarettes, uncomplicated: Secondary | ICD-10-CM | POA: Insufficient documentation

## 2016-05-21 DIAGNOSIS — D729 Disorder of white blood cells, unspecified: Secondary | ICD-10-CM

## 2016-05-21 DIAGNOSIS — R6889 Other general symptoms and signs: Secondary | ICD-10-CM | POA: Diagnosis present

## 2016-05-21 DIAGNOSIS — R109 Unspecified abdominal pain: Secondary | ICD-10-CM | POA: Diagnosis present

## 2016-05-21 DIAGNOSIS — R0602 Shortness of breath: Secondary | ICD-10-CM | POA: Insufficient documentation

## 2016-05-21 DIAGNOSIS — K2971 Gastritis, unspecified, with bleeding: Secondary | ICD-10-CM | POA: Insufficient documentation

## 2016-05-21 DIAGNOSIS — I160 Hypertensive urgency: Secondary | ICD-10-CM | POA: Insufficient documentation

## 2016-05-21 DIAGNOSIS — R200 Anesthesia of skin: Secondary | ICD-10-CM | POA: Insufficient documentation

## 2016-05-21 DIAGNOSIS — D72829 Elevated white blood cell count, unspecified: Secondary | ICD-10-CM | POA: Insufficient documentation

## 2016-05-21 DIAGNOSIS — Z9049 Acquired absence of other specified parts of digestive tract: Secondary | ICD-10-CM | POA: Insufficient documentation

## 2016-05-21 DIAGNOSIS — R69 Illness, unspecified: Secondary | ICD-10-CM

## 2016-05-21 DIAGNOSIS — R519 Headache, unspecified: Secondary | ICD-10-CM

## 2016-05-21 DIAGNOSIS — A419 Sepsis, unspecified organism: Secondary | ICD-10-CM | POA: Diagnosis present

## 2016-05-21 DIAGNOSIS — R0789 Other chest pain: Secondary | ICD-10-CM

## 2016-05-21 HISTORY — DX: Unspecified viral hepatitis B without hepatic coma: B19.10

## 2016-05-21 LAB — CBC WITH DIFFERENTIAL/PLATELET
BASOS ABS: 0.1 10*3/uL (ref 0.0–0.1)
BASOS PCT: 0 %
EOS PCT: 0 %
Eosinophils Absolute: 0 10*3/uL (ref 0.0–0.7)
HCT: 44.8 % (ref 36.0–46.0)
Hemoglobin: 15.8 g/dL — ABNORMAL HIGH (ref 12.0–15.0)
LYMPHS PCT: 9 %
Lymphs Abs: 2 10*3/uL (ref 0.7–4.0)
MCH: 26.9 pg (ref 26.0–34.0)
MCHC: 35.3 g/dL (ref 30.0–36.0)
MCV: 76.3 fL — ABNORMAL LOW (ref 78.0–100.0)
MONO ABS: 0.7 10*3/uL (ref 0.1–1.0)
Monocytes Relative: 3 %
NEUTROS ABS: 18.8 10*3/uL — AB (ref 1.7–7.7)
Neutrophils Relative %: 88 %
PLATELETS: 331 10*3/uL (ref 150–400)
RBC: 5.87 MIL/uL — AB (ref 3.87–5.11)
RDW: 13.6 % (ref 11.5–15.5)
WBC: 21.6 10*3/uL — ABNORMAL HIGH (ref 4.0–10.5)

## 2016-05-21 LAB — COMPREHENSIVE METABOLIC PANEL
ALT: 28 U/L (ref 14–54)
AST: 29 U/L (ref 15–41)
Albumin: 5.2 g/dL — ABNORMAL HIGH (ref 3.5–5.0)
Alkaline Phosphatase: 67 U/L (ref 38–126)
Anion gap: 14 (ref 5–15)
BILIRUBIN TOTAL: 0.9 mg/dL (ref 0.3–1.2)
BUN: 12 mg/dL (ref 6–20)
CHLORIDE: 100 mmol/L — AB (ref 101–111)
CO2: 22 mmol/L (ref 22–32)
CREATININE: 0.56 mg/dL (ref 0.44–1.00)
Calcium: 10.1 mg/dL (ref 8.9–10.3)
Glucose, Bld: 189 mg/dL — ABNORMAL HIGH (ref 65–99)
POTASSIUM: 2.6 mmol/L — AB (ref 3.5–5.1)
Sodium: 136 mmol/L (ref 135–145)
TOTAL PROTEIN: 9 g/dL — AB (ref 6.5–8.1)

## 2016-05-21 LAB — TROPONIN I

## 2016-05-21 LAB — LIPASE, BLOOD: Lipase: 18 U/L (ref 11–51)

## 2016-05-21 MED ORDER — FENTANYL CITRATE (PF) 100 MCG/2ML IJ SOLN
50.0000 ug | INTRAMUSCULAR | Status: DC | PRN
  Administered 2016-05-21: 50 ug via INTRAVENOUS
  Filled 2016-05-21: qty 2

## 2016-05-21 MED ORDER — ONDANSETRON HCL 4 MG/2ML IJ SOLN
4.0000 mg | Freq: Once | INTRAMUSCULAR | Status: AC
  Administered 2016-05-21: 4 mg via INTRAVENOUS
  Filled 2016-05-21: qty 2

## 2016-05-21 MED ORDER — FENTANYL CITRATE (PF) 100 MCG/2ML IJ SOLN
50.0000 ug | Freq: Once | INTRAMUSCULAR | Status: AC
  Administered 2016-05-22: 50 ug via INTRAVENOUS
  Filled 2016-05-21: qty 2

## 2016-05-21 MED ORDER — POTASSIUM CHLORIDE 10 MEQ/100ML IV SOLN
10.0000 meq | INTRAVENOUS | Status: DC
  Administered 2016-05-22 (×2): 10 meq via INTRAVENOUS
  Filled 2016-05-21 (×3): qty 100

## 2016-05-21 NOTE — ED Triage Notes (Signed)
Fever, cough, body aches, vomiting. Chest pain. She took Tylenol pm 4 hours ago.

## 2016-05-21 NOTE — ED Notes (Signed)
Dr. Molpus at bedside. 

## 2016-05-21 NOTE — ED Notes (Signed)
Dr. Madilyn Hookees made aware of pt's B/P

## 2016-05-21 NOTE — ED Provider Notes (Signed)
[facility] Provider Note: Theresa Dell, MD, FACEP  CSN: [id number] MRN: [id number] ARRIVAL: 05/21/16 at 2134 ROOM: MH10/MH10   CHIEF COMPLAINT  Chest Pain   HISTORY OF PRESENT ILLNESS  Theresa Galloway is a 51 y.o. female who complains of being sick for over a week. She was seen at [facility] regional on the sixth of this month for cough, sweats, chills, headache, back pain and abdominal pain. She also reported at that time vomiting at night for 1-1/2 weeks. She rated her pain as a 10 out of 10 at that time. Workup was unremarkable with normal laboratory studies and unremarkable CT of the abdomen and pelvis.  Her symptoms worsened and she was seen again at [facility] regional yesterday. She has had worsening nausea and vomiting and has not been able to keep any medications on her stomach. Laboratory work yesterday included a white blood cell count of 24.8 with an ANC of 21.3. Her potassium was noted to be 3. Lipase was 19. Repeat CT of the abdomen and pelvis was unremarkable. She was discharged with a diagnosis of gastritis with hemorrhage. How this diagnosis was determined is not obvious in the chart.  She attempted to return to [facility] regional today but it was too busy. She continues to complain of intractable vomiting, severe low back and periumbilical pain, pleuritic right chest pain, shortness of breath, subjective fever, generalized malaise and weakness.    Past Medical History:  Diagnosis Date  . Hepatitis B   . Hypertension     Past Surgical History:  Procedure Laterality Date  . ABDOMINAL HYSTERECTOMY    . CHOLECYSTECTOMY      No family history on file.  Social History  Substance Use Topics  . Smoking status: Current Every Day Smoker    Packs/day: 0.50    Types: Cigarettes  . Smokeless tobacco: Never Used  . Alcohol use No    Prior to Admission medications   Medication Sig Start Date End Date Taking? Authorizing Provider  lisinopril  (PRINIVIL,ZESTRIL) 10 MG tablet Take 1 tablet (10 mg total) by mouth daily. 10/03/14  Yes Myra Rude, MD    Allergies Patient has no known allergies.   REVIEW OF SYSTEMS  Negative except as noted here or in the History of Present Illness.   PHYSICAL EXAMINATION  Initial Vital Signs Blood pressure (!) 216/121, pulse 65, temperature 98.4 F (36.9 C), temperature source Oral, resp. rate 16, height 5\' 3"  (1.6 m), weight 160 lb (72.6 kg), SpO2 95 %.  Examination General: Well-developed, well-nourished female in no acute distress; appearance consistent with age of record HENT: normocephalic; atraumatic Eyes: pupils equal, round and reactive to light; extraocular muscles intact Neck: supple Heart: regular rate and rhythm Lungs: clear to auscultation bilaterally Abdomen: soft; nondistended; periumbilical tenderness; no masses or hepatosplenomegaly; bowel sounds present Extremities: No deformity; full range of motion; pulses normal Neurologic: Awake, alert and oriented; motor function intact in all extremities and symmetric; no facial droop Skin: Warm and dry Psychiatric: Flat affect   RESULTS  Summary of this visit's results, reviewed by myself:   EKG Interpretation  Date/Time:  Friday May 21 2016 22:03:45 EST Ventricular Rate:  95 PR Interval:    QRS Duration: 83 QT Interval:  403 QTC Calculation: 507 R Axis:   -41 Text Interpretation:  Sinus rhythm Prolonged PR interval Left atrial enlargement Left anterior fascicular block Probable left ventricular hypertrophy Anterior Q waves, possibly due to LVH Baseline wander in lead(s)  II III aVL aVF Prolonged QT Rate is faster Confirmed by Novah Nessel  MD, Jonny Ruiz ([id number]) on 05/21/2016 10:37:20 PM      Laboratory Studies: Results for orders placed or performed during the hospital encounter of 05/21/16 (from the past 24 hour(s))  Comprehensive metabolic panel     Status: Abnormal   Collection Time: 05/21/16 10:49 PM  Result Value Ref  Range   Sodium 136 135 - 145 mmol/L   Potassium 2.6 (LL) 3.5 - 5.1 mmol/L   Chloride 100 (L) 101 - 111 mmol/L   CO2 22 22 - 32 mmol/L   Glucose, Bld 189 (H) 65 - 99 mg/dL   BUN 12 6 - 20 mg/dL   Creatinine, Ser 4.09 0.44 - 1.00 mg/dL   Calcium 81.1 8.9 - 91.4 mg/dL   Total Protein 9.0 (H) 6.5 - 8.1 g/dL   Albumin 5.2 (H) 3.5 - 5.0 g/dL   AST 29 15 - 41 U/L   ALT 28 14 - 54 U/L   Alkaline Phosphatase 67 38 - 126 U/L   Total Bilirubin 0.9 0.3 - 1.2 mg/dL   GFR calc non Af Amer >60 >60 mL/min   GFR calc Af Amer >60 >60 mL/min   Anion gap 14 5 - 15  Troponin I     Status: None   Collection Time: 05/21/16 10:49 PM  Result Value Ref Range   Troponin I <0.03 <0.03 ng/mL  CBC with Differential     Status: Abnormal   Collection Time: 05/21/16 10:49 PM  Result Value Ref Range   WBC 21.6 (H) 4.0 - 10.5 K/uL   RBC 5.87 (H) 3.87 - 5.11 MIL/uL   Hemoglobin 15.8 (H) 12.0 - 15.0 g/dL   HCT 78.2 95.6 - 21.3 %   MCV 76.3 (L) 78.0 - 100.0 fL   MCH 26.9 26.0 - 34.0 pg   MCHC 35.3 30.0 - 36.0 g/dL   RDW 08.6 57.8 - 46.9 %   Platelets 331 150 - 400 K/uL   Neutrophils Relative % 88 %   Neutro Abs 18.8 (H) 1.7 - 7.7 K/uL   Lymphocytes Relative 9 %   Lymphs Abs 2.0 0.7 - 4.0 K/uL   Monocytes Relative 3 %   Monocytes Absolute 0.7 0.1 - 1.0 K/uL   Eosinophils Relative 0 %   Eosinophils Absolute 0.0 0.0 - 0.7 K/uL   Basophils Relative 0 %   Basophils Absolute 0.1 0.0 - 0.1 K/uL  Lipase, blood     Status: None   Collection Time: 05/21/16 10:49 PM  Result Value Ref Range   Lipase 18 11 - 51 U/L   Imaging Studies: Dg Chest 2 View  Result Date: 05/21/2016 CLINICAL DATA:  Chest pain and cough EXAM: CHEST  2 VIEW COMPARISON:  12/17/2015 FINDINGS: Borderline cardiomegaly with slight uncoiling of the thoracic aorta as before. No pulmonary consolidation, effusion or pneumothorax. No suspicious osseous abnormalities. IMPRESSION: No active cardiopulmonary disease. Electronically Signed   By: Tollie Eth  M.D.   On: 05/21/2016 23:26    ED COURSE  Nursing notes and initial vitals signs, including pulse oximetry, reviewed.  Vitals:   05/21/16 2215 05/21/16 2228 05/21/16 2250 05/21/16 2340  BP:  (!) 198/142 (!) 216/121 (!) 218/109  Pulse: 93 91 65 82  Resp: 15 21 16 18   Temp:   98.4 F (36.9 C)   TempSrc:   Oral   SpO2: 100% 99% 95% 100%  Weight:      Height:  12:07 AM Patient's nausea improved with IV Zofran. Patient still complains of low back and abdominal pain. Her potassium is worsened from 3.0 yesterday to 2.6 today. She still has an elevated white blood count although not as severe just today. Due to persistent nausea and vomiting we will have the patient admitted.  PROCEDURES    ED DIAGNOSES     ICD-9-CM ICD-10-CM   1. Persistent vomiting in adult patient 536.2 R11.10   2. Periumbilical abdominal pain 789.05 R10.33   3. Other chest pain 786.59 R07.89   4. Influenza-like illness 799.89 R69   5. Hypokalemia due to loss of potassium 276.8 E87.6   6. Neutrophilic leukocytosis 288.8 D72.9   7. Hypertension not at goal 401.9 I10        Paula LibraJohn Eliany Mccarter, MD 05/22/16 [phone]

## 2016-05-22 ENCOUNTER — Encounter (HOSPITAL_COMMUNITY): Payer: Self-pay | Admitting: Internal Medicine

## 2016-05-22 DIAGNOSIS — R05 Cough: Secondary | ICD-10-CM | POA: Diagnosis not present

## 2016-05-22 DIAGNOSIS — E876 Hypokalemia: Secondary | ICD-10-CM | POA: Diagnosis present

## 2016-05-22 DIAGNOSIS — R112 Nausea with vomiting, unspecified: Secondary | ICD-10-CM

## 2016-05-22 DIAGNOSIS — I10 Essential (primary) hypertension: Secondary | ICD-10-CM | POA: Diagnosis present

## 2016-05-22 DIAGNOSIS — R6889 Other general symptoms and signs: Secondary | ICD-10-CM | POA: Diagnosis present

## 2016-05-22 DIAGNOSIS — R059 Cough, unspecified: Secondary | ICD-10-CM | POA: Diagnosis present

## 2016-05-22 DIAGNOSIS — R109 Unspecified abdominal pain: Secondary | ICD-10-CM | POA: Diagnosis not present

## 2016-05-22 DIAGNOSIS — R079 Chest pain, unspecified: Secondary | ICD-10-CM | POA: Diagnosis not present

## 2016-05-22 DIAGNOSIS — A419 Sepsis, unspecified organism: Secondary | ICD-10-CM

## 2016-05-22 LAB — RAPID URINE DRUG SCREEN, HOSP PERFORMED
AMPHETAMINES: NOT DETECTED
Barbiturates: NOT DETECTED
Benzodiazepines: NOT DETECTED
COCAINE: POSITIVE — AB
OPIATES: POSITIVE — AB
TETRAHYDROCANNABINOL: POSITIVE — AB

## 2016-05-22 LAB — LIPID PANEL
Cholesterol: 194 mg/dL (ref 0–200)
HDL: 41 mg/dL (ref >40)
LDL CALC: 139 mg/dL — AB (ref 0–99)
Total CHOL/HDL Ratio: 4.7 RATIO
Triglycerides: 69 mg/dL (ref <150)
VLDL: 14 mg/dL (ref 0–40)

## 2016-05-22 LAB — BASIC METABOLIC PANEL
Anion gap: 10 (ref 5–15)
BUN: 10 mg/dL (ref 6–20)
CALCIUM: 8.6 mg/dL — AB (ref 8.9–10.3)
CHLORIDE: 106 mmol/L (ref 101–111)
CO2: 21 mmol/L — AB (ref 22–32)
CREATININE: 0.59 mg/dL (ref 0.44–1.00)
GFR calc non Af Amer: >60 mL/min (ref >60)
GLUCOSE: 127 mg/dL — AB (ref 65–99)
Potassium: 3.1 mmol/L — ABNORMAL LOW (ref 3.5–5.1)
Sodium: 137 mmol/L (ref 135–145)

## 2016-05-22 LAB — CBC
HCT: 38.6 % (ref 36.0–46.0)
Hemoglobin: 12.9 g/dL (ref 12.0–15.0)
MCH: 26.6 pg (ref 26.0–34.0)
MCHC: 33.4 g/dL (ref 30.0–36.0)
MCV: 79.6 fL (ref 78.0–100.0)
PLATELETS: 270 10*3/uL (ref 150–400)
RBC: 4.85 MIL/uL (ref 3.87–5.11)
RDW: 13.6 % (ref 11.5–15.5)
WBC: 23.1 10*3/uL — ABNORMAL HIGH (ref 4.0–10.5)

## 2016-05-22 LAB — URINALYSIS, ROUTINE W REFLEX MICROSCOPIC
Bacteria, UA: NONE SEEN
Bilirubin Urine: NEGATIVE
GLUCOSE, UA: NEGATIVE mg/dL
KETONES UR: NEGATIVE mg/dL
Nitrite: NEGATIVE
Protein, ur: NEGATIVE mg/dL
Specific Gravity, Urine: 1.009 (ref 1.005–1.030)
pH: 5 (ref 5.0–8.0)

## 2016-05-22 LAB — D-DIMER, QUANTITATIVE: D-Dimer, Quant: 0.38 ug/mL-FEU (ref 0.00–0.50)

## 2016-05-22 LAB — PROCALCITONIN: Procalcitonin: <0.1 ng/mL

## 2016-05-22 LAB — LACTIC ACID, PLASMA
LACTIC ACID, VENOUS: 1.1 mmol/L (ref 0.5–1.9)
Lactic Acid, Venous: 1.2 mmol/L (ref 0.5–1.9)

## 2016-05-22 LAB — TROPONIN I
TROPONIN I: 0.03 ng/mL — AB (ref <0.03)
TROPONIN I: 0.06 ng/mL — AB (ref <0.03)
Troponin I: 0.03 ng/mL (ref <0.03)

## 2016-05-22 LAB — INFLUENZA PANEL BY PCR (TYPE A & B)
INFLBPCR: NEGATIVE
Influenza A By PCR: NEGATIVE

## 2016-05-22 LAB — HIV ANTIBODY (ROUTINE TESTING W REFLEX): HIV Screen 4th Generation wRfx: NONREACTIVE

## 2016-05-22 LAB — GLUCOSE, CAPILLARY: GLUCOSE-CAPILLARY: 100 mg/dL — AB (ref 65–99)

## 2016-05-22 LAB — MRSA PCR SCREENING: MRSA by PCR: POSITIVE — AB

## 2016-05-22 MED ORDER — ENOXAPARIN SODIUM 40 MG/0.4ML ~~LOC~~ SOLN
40.0000 mg | SUBCUTANEOUS | Status: DC
  Administered 2016-05-22 – 2016-05-23 (×2): 40 mg via SUBCUTANEOUS
  Filled 2016-05-22 (×2): qty 0.4

## 2016-05-22 MED ORDER — PANTOPRAZOLE SODIUM 40 MG IV SOLR
40.0000 mg | Freq: Once | INTRAVENOUS | Status: AC
  Administered 2016-05-22: 40 mg via INTRAVENOUS
  Filled 2016-05-22: qty 40

## 2016-05-22 MED ORDER — POTASSIUM CHLORIDE CRYS ER 20 MEQ PO TBCR
40.0000 meq | EXTENDED_RELEASE_TABLET | Freq: Once | ORAL | Status: AC
  Administered 2016-05-22: 40 meq via ORAL
  Filled 2016-05-22: qty 2

## 2016-05-22 MED ORDER — SODIUM CHLORIDE 0.9 % IV SOLN
30.0000 meq | Freq: Once | INTRAVENOUS | Status: AC
  Administered 2016-05-22: 30 meq via INTRAVENOUS
  Filled 2016-05-22: qty 15

## 2016-05-22 MED ORDER — OXYCODONE-ACETAMINOPHEN 5-325 MG PO TABS
1.0000 | ORAL_TABLET | ORAL | Status: DC | PRN
  Administered 2016-05-22 – 2016-05-23 (×3): 1 via ORAL
  Filled 2016-05-22 (×3): qty 1

## 2016-05-22 MED ORDER — DM-GUAIFENESIN ER 30-600 MG PO TB12
1.0000 | ORAL_TABLET | Freq: Two times a day (BID) | ORAL | Status: DC
  Administered 2016-05-22 – 2016-05-24 (×6): 1 via ORAL
  Filled 2016-05-22 (×6): qty 1

## 2016-05-22 MED ORDER — ALBUTEROL SULFATE (2.5 MG/3ML) 0.083% IN NEBU: 2.5000 mg | INHALATION_SOLUTION | RESPIRATORY_TRACT | Status: DC | PRN

## 2016-05-22 MED ORDER — ACETAMINOPHEN 325 MG PO TABS
650.0000 mg | ORAL_TABLET | Freq: Four times a day (QID) | ORAL | Status: DC | PRN
  Administered 2016-05-23 – 2016-05-24 (×2): 650 mg via ORAL
  Filled 2016-05-22 (×2): qty 2

## 2016-05-22 MED ORDER — SODIUM CHLORIDE 0.9 % IV SOLN
Freq: Once | INTRAVENOUS | Status: AC
  Administered 2016-05-22: via INTRAVENOUS

## 2016-05-22 MED ORDER — OSELTAMIVIR PHOSPHATE 75 MG PO CAPS
75.0000 mg | ORAL_CAPSULE | Freq: Two times a day (BID) | ORAL | Status: DC
  Administered 2016-05-22 (×2): 75 mg via ORAL
  Filled 2016-05-22 (×2): qty 1

## 2016-05-22 MED ORDER — MAGNESIUM SULFATE IN D5W 1-5 GM/100ML-% IV SOLN
1.0000 g | Freq: Once | INTRAVENOUS | Status: AC
  Administered 2016-05-22: 1 g via INTRAVENOUS
  Filled 2016-05-22: qty 100

## 2016-05-22 MED ORDER — SODIUM CHLORIDE 0.9 % IV SOLN
INTRAVENOUS | Status: DC
  Administered 2016-05-22 – 2016-05-23 (×3): via INTRAVENOUS

## 2016-05-22 MED ORDER — HYDROMORPHONE HCL 2 MG/ML IJ SOLN
1.0000 mg | INTRAMUSCULAR | Status: DC | PRN
  Administered 2016-05-22 – 2016-05-23 (×8): 1 mg via INTRAVENOUS
  Filled 2016-05-22 (×7): qty 1

## 2016-05-22 MED ORDER — MUPIROCIN 2 % EX OINT
TOPICAL_OINTMENT | Freq: Two times a day (BID) | CUTANEOUS | Status: DC
Start: 2016-05-22 — End: 2016-05-24
  Administered 2016-05-22 – 2016-05-24 (×4): via NASAL
  Filled 2016-05-22 (×2): qty 22

## 2016-05-22 MED ORDER — HYDROXYZINE HCL 50 MG/ML IM SOLN
25.0000 mg | Freq: Four times a day (QID) | INTRAMUSCULAR | Status: DC | PRN
  Administered 2016-05-23: 25 mg via INTRAMUSCULAR
  Filled 2016-05-22 (×3): qty 0.5

## 2016-05-22 MED ORDER — MUPIROCIN 2 % EX OINT
TOPICAL_OINTMENT | CUTANEOUS | Status: AC
  Filled 2016-05-22: qty 22

## 2016-05-22 MED ORDER — LISINOPRIL 10 MG PO TABS
10.0000 mg | ORAL_TABLET | Freq: Every day | ORAL | Status: DC
  Administered 2016-05-22: 10 mg via ORAL
  Filled 2016-05-22: qty 1

## 2016-05-22 MED ORDER — DEXTROSE 5 % IV SOLN
500.0000 mg | INTRAVENOUS | Status: DC
  Administered 2016-05-22 – 2016-05-23 (×2): 500 mg via INTRAVENOUS
  Filled 2016-05-22 (×2): qty 500

## 2016-05-22 MED ORDER — ENALAPRILAT 1.25 MG/ML IV SOLN
1.2500 mg | Freq: Once | INTRAVENOUS | Status: AC
  Administered 2016-05-22: 1.25 mg via INTRAVENOUS
  Filled 2016-05-22: qty 2

## 2016-05-22 MED ORDER — PANTOPRAZOLE SODIUM 40 MG IV SOLR
40.0000 mg | Freq: Two times a day (BID) | INTRAVENOUS | Status: DC
  Administered 2016-05-22 – 2016-05-23 (×4): 40 mg via INTRAVENOUS
  Filled 2016-05-22 (×4): qty 40

## 2016-05-22 MED ORDER — SODIUM CHLORIDE 0.9 % IV BOLUS (SEPSIS)
2000.0000 mL | Freq: Once | INTRAVENOUS | Status: AC
  Administered 2016-05-22: 2000 mL via INTRAVENOUS

## 2016-05-22 MED ORDER — ONDANSETRON HCL 4 MG/2ML IJ SOLN: 4.0000 mg | Freq: Three times a day (TID) | INTRAMUSCULAR | Status: DC | PRN

## 2016-05-22 MED ORDER — HYDRALAZINE HCL 20 MG/ML IJ SOLN
5.0000 mg | INTRAMUSCULAR | Status: DC | PRN
  Administered 2016-05-23: 5 mg via INTRAVENOUS
  Filled 2016-05-22: qty 1

## 2016-05-22 MED ORDER — NICOTINE 21 MG/24HR TD PT24
21.0000 mg | MEDICATED_PATCH | Freq: Every day | TRANSDERMAL | Status: DC
  Filled 2016-05-22 (×2): qty 1

## 2016-05-22 MED ORDER — ACETAMINOPHEN 650 MG RE SUPP: 650.0000 mg | Freq: Four times a day (QID) | RECTAL | Status: DC | PRN

## 2016-05-22 NOTE — Progress Notes (Signed)
CRITICAL VALUE ALERT  Critical value received:  Troponin 0.06  Date of notification:  05/22/16  Time of notification:  17:11  Critical value read back:Yes.    Nurse who received alert:  Midge AverVicki Amarius Toto RN  MD notified (1st page):  Dr. Roda ShuttersXu  Time of first page:  17:12

## 2016-05-22 NOTE — Progress Notes (Signed)
CRITICAL VALUE ALERT  Critical value received: troponin 0.03  Date of notification: 05/22/2016  Time of notification: 0615  Critical value read back: yes  Nurse who received alert:  Jamelle Rushingleticia Ura Hausen  MD notified (1st page):  Burnadette PeterLynch  Time of first page:  0617  MD notified (2nd page):  Time of second page:  Responding MD:  lynch  Time MD responded:  [phone]

## 2016-05-22 NOTE — H&P (Signed)
History and Physical    Theresa Galloway ZOX:[id number] DOB: 07/08/1965 DOA: 05/21/2016  Referring MD/NP/PA:   PCP: No PCP Per Patient   Patient coming from:  The patient is coming from home.  At baseline, pt is independent for most of ADL.        Chief Complaint: Nausea, vomiting, abdominal pain, fever, cough, chest pain  HPI: Theresa Galloway is a 51 y.o. female with medical history significant of hypertension, hepatitis B, tobacco abuse, who presents with nausea, vomiting, abdominal pain, fever, cough and chest pain.  Patient states that she has been sick for more than one week. She has been having nausea, vomiting, abdominal pain, fever, cough, chest pain. She was seen at [facility] on 05/18/16. She had unremarkable CT of the abdomen and pelvis and workup was unremarkable with normal laboratory studies, including urinalysis per patient, but not tested for Flu. Her symptoms worsened and she was seen again at [facility] yesterday. Laboratory work yesterday included a white blood cell count of 24.8 with an ANC of 21.3. Her potassium was noted to be 3. Lipase was 19. Repeat CT of the abdomen and pelvis was unremarkable. She was discharged with diagnosis of gastritis with hemorrhage. Pt states that she was given prescription for antibiotics, but does not remember the name.   She states that her symptoms have not improved at all. She continues to have intractable nausea and vomiting and abdominal pain. She vomited 15-20 times today. She saw streaks of blood in the vomitus. Patient has diffused abdominal pain, which is constant, 10 out of 10, nonradiating. Patient does not have diarrhea. Denies symptoms of UTI.  She continues to have cough with yellow colored sputum production, fever, body aches. She also has chest pain, which is located in the central chest, constant, 5 out of 10 in severity, pleuritic, aggravated by deep breath. Denies tenderness over calf areas. Currently patient does not  have SOB. She also has lower back pain. Patient states that she has chronic mild right arm numbness which has been going on for more than 6 month, not changed today. Patient does not have facial droop, vision change, hearing loss, unilateral weakness.  ED Course: pt was found to have WBC 21.6, negative troponin, lipase 18, potassium 2.6, creatinine normal, temperature normal, elevated blood pressure 237/113-->166/100, tachycardia, tachypnea, oxygen saturation 95% on room air, negative chest x-ray. Patient is placed on MedSurg bed for observation.  Review of Systems:   General: has fevers, chills, no changes in body weight, has poor appetite, has fatigue HEENT: no blurry vision, hearing changes or sore throat Respiratory: no dyspnea, has coughing, no wheezing CV: has chest pain, no palpitations GI: has nausea, vomiting, abdominal pain, no diarrhea, constipation GU: no dysuria, burning on urination, increased urinary frequency, hematuria  Ext: no leg edema Neuro: no unilateral weakness, numbness, or tingling, no vision change or hearing loss Skin: no rash, no skin tear. MSK: No muscle spasm, no deformity, no limitation of range of movement in spin Heme: No easy bruising.  Travel history: No recent long distant travel.  Allergy: No Known Allergies  Past Medical History:  Diagnosis Date  . Hepatitis B   . Hypertension     Past Surgical History:  Procedure Laterality Date  . ABDOMINAL HYSTERECTOMY    . CHOLECYSTECTOMY      Social History:  reports that she has been smoking Cigarettes.  She has been smoking about 0.50 packs per day. She has never used smokeless  tobacco. She reports that she does not drink alcohol or use drugs.  Family History:  Family History  Problem Relation Age of Onset  . Gastric cancer Mother   . Obesity Father   . Diabetes Mellitus II Brother   . Kidney disease Brother   . Congestive Heart Failure Brother      Prior to Admission medications   Medication  Sig Start Date End Date Taking? Authorizing Provider  lisinopril (PRINIVIL,ZESTRIL) 10 MG tablet Take 1 tablet (10 mg total) by mouth daily. 10/03/14  Yes Myra RudeJeremy E Schmitz, MD    Physical Exam: Vitals:   05/22/16 0045 05/22/16 0100 05/22/16 0115 05/22/16 0241  BP: (!) 201/111 142/98 166/100 (!) 170/84  Pulse: 73 79 84 (!) 105  Resp: 11 19 15 18   Temp:    98.2 F (36.8 C)  TempSrc:    Oral  SpO2: 99% 96% 99% 100%  Weight:    73 kg (161 lb)  Height:    5\' 3"  (1.6 m)   General: Not in acute distress HEENT:       Eyes: PERRL, EOMI, no scleral icterus.       ENT: No discharge from the ears and nose, no pharynx injection, no tonsillar enlargement.        Neck: No JVD, no bruit, no mass felt. Heme: No neck lymph node enlargement. Cardiac: S1/S2, RRR, No murmurs, No gallops or rubs. Has reproducible chest wall tenderness to palpation. Respiratory: No rales, wheezing, rhonchi or rubs. GI: Soft, nondistended, has diffused tenderness, no rebound pain, no organomegaly, BS present. GU: No hematuria Ext: No pitting leg edema bilaterally. 2+DP/PT pulse bilaterally. Musculoskeletal: No joint deformities, No joint redness or warmth, no limitation of ROM in spin. Skin: No rashes.  Neuro: Alert, oriented X3, cranial nerves II-XII grossly intact, moves all extremities normally. Psych: Patient is not psychotic, no suicidal or hemocidal ideation.  Labs on Admission: I have personally reviewed following labs and imaging studies  CBC:  Recent Labs Lab 05/21/16 2249  WBC 21.6*  NEUTROABS 18.8*  HGB 15.8*  HCT 44.8  MCV 76.3*  PLT 331   Basic Metabolic Panel:  Recent Labs Lab 05/21/16 2249  NA 136  K 2.6*  CL 100*  CO2 22  GLUCOSE 189*  BUN 12  CREATININE 0.56  CALCIUM 10.1   GFR: Estimated Creatinine Clearance: 81.4 mL/min (by C-G formula based on SCr of 0.56 mg/dL). Liver Function Tests:  Recent Labs Lab 05/21/16 2249  AST 29  ALT 28  ALKPHOS 67  BILITOT 0.9  PROT 9.0*    ALBUMIN 5.2*    Recent Labs Lab 05/21/16 2249  LIPASE 18   No results for input(s): AMMONIA in the last 168 hours. Coagulation Profile: No results for input(s): INR, PROTIME in the last 168 hours. Cardiac Enzymes:  Recent Labs Lab 05/21/16 2249  TROPONINI <0.03   BNP (last 3 results) No results for input(s): PROBNP in the last 8760 hours. HbA1C: No results for input(s): HGBA1C in the last 72 hours. CBG: No results for input(s): GLUCAP in the last 168 hours. Lipid Profile: No results for input(s): CHOL, HDL, LDLCALC, TRIG, CHOLHDL, LDLDIRECT in the last 72 hours. Thyroid Function Tests: No results for input(s): TSH, T4TOTAL, FREET4, T3FREE, THYROIDAB in the last 72 hours. Anemia Panel: No results for input(s): VITAMINB12, FOLATE, FERRITIN, TIBC, IRON, RETICCTPCT in the last 72 hours. Urine analysis:    Component Value Date/Time   COLORURINE YELLOW 12/16/2015 2302   APPEARANCEUR CLEAR 12/16/2015 2302  LABSPEC 1.017 12/16/2015 2302   PHURINE 8.5 (H) 12/16/2015 2302   GLUCOSEU NEGATIVE 12/16/2015 2302   HGBUR NEGATIVE 12/16/2015 2302   BILIRUBINUR NEGATIVE 12/16/2015 2302   KETONESUR 15 (A) 12/16/2015 2302   PROTEINUR 100 (A) 12/16/2015 2302   UROBILINOGEN 0.2 10/03/2014 0800   NITRITE NEGATIVE 12/16/2015 2302   LEUKOCYTESUR NEGATIVE 12/16/2015 2302   Sepsis Labs: @LABRCNTIP (procalcitonin:4,lacticidven:4) )No results found for this or any previous visit (from the past 240 hour(s)).   Radiological Exams on Admission: Dg Chest 2 View  Result Date: 05/21/2016 CLINICAL DATA:  Chest pain and cough EXAM: CHEST  2 VIEW COMPARISON:  12/17/2015 FINDINGS: Borderline cardiomegaly with slight uncoiling of the thoracic aorta as before. No pulmonary consolidation, effusion or pneumothorax. No suspicious osseous abnormalities. IMPRESSION: No active cardiopulmonary disease. Electronically Signed   By: Tollie Eth M.D.   On: 05/21/2016 23:26    EKG: Independently reviewed.  Sinus  rhythm, QT prolongation 507, LAD, anteroseptal infarction pattern  Assessment/Plan Principal Problem:   Intractable nausea and vomiting Active Problems:   Hypertension   Chest pain   Abdominal pain   Sepsis (HCC)   Hypokalemia   Cough   Flu-like symptoms   Intractable nausea and vomiting and abdominal pain: Etiology is not clear. Lipase negative. Patient had negative CT abdomen/pelvis twic. Likely due to viral gastroenteritis. Patient does not have history of diabetes, less likely to have a gastroparesis.  -will place on med-surg bed for obs -IVF -prn hydroxyzine for nausea and vomiting (patient has QTc prolongation, not good candidate for Zofran or Reglan) -When necessary Dilaudid for pain -check hepatitis panel and HIV abx -IV protonix  Flu-like symptoms: -start Tamiflu empirically, and follow up flu PCR -start azithromycin empirically for possible bronchitis due to productive cough -Sputum culture  -When necessary Mucinex for cough  Chest pain: Chest is negative. Patient has reproducible chest wall tenderness, including musculoskeletal pain. Patient does not have signs of DVT, no SOB currently or O2 desaturation-->low suspicions for PE. Patient had elevated blood pressure, demand ischemia cannot be completely ruled out. -pain control: When necessary Tylenol and Percocet. Also on when necessary Dilaudid for abdominal pain. -Troponin 3 -Check A1c, FLP  Hypertension: -Continue lisinopril -IV hydralazine when necessary  Sepsis: Patient meets criteria for sepsis likely due to viral infection -Antibiotics as above -will get Procalcitonin and trend lactic acid levels per sepsis protocol. -IVF: 2L of NS bolus in ED, followed by 125 cc/h    Hypokalemia: K= 2.6 on admission. - Repleted K - give 1 g of magnesium sulfate - Check Mg level  DVT ppx: SQ Lovenox Code Status: Full code Family Communication: None at bed side.   Disposition Plan:  Anticipate discharge back to  previous home environment Consults called:  none Admission status: medical floor/obs   Date of Service 05/22/2016    Lorretta Harp [facility] Pager [phone]  If 7PM-7AM, please contact night-coverage [email or web address] Password [email or web address] 05/22/2016, 3:28 AM

## 2016-05-22 NOTE — Progress Notes (Addendum)
Pt has an elevated troponin and a VAS US for RUA redness/swelling. VAS US office is closed till Monday. The phone line says to send pt to ED if situation is emergent. MD on call notified to clarify the plan of care. Will continue to monitor.  MD on call ordered a D-dimer. D- dimer was 0.38. MD notified. Will continue to monitor.

## 2016-05-22 NOTE — Progress Notes (Signed)
PROGRESS NOTE  Shaquitta Burbridge [facility] UEA:[id number] DOB: March 02, 1966 DOA: 05/21/2016 PCP: No PCP Per Patient  HPI/Recap of past 24 hours:  She is crying, states she feels hungry and she want to eat  Assessment/Plan: Principal Problem:   Intractable nausea and vomiting Active Problems:   Hypertension   Chest pain   Abdominal pain   Sepsis (HCC)   Hypokalemia   Cough   Flu-like symptoms  N/V/abdominal pain, with h/o same requiring multiple ED visits H/o gastric ulcer, h/o s/p cholecystectomy, h/o abdominal hysterectomy lft and lipase unremarkable with negative Ct ab /pel on 2/6 and 2/9 H/o polysubstance abuse, uds + opitaes, cocaine, THC. Symptom could be from viral gastroenteritis, could be from substance abuse. She want to eat, she refuse clears, will start on full liquid, advance diet as tolerated.  Fever, cough, chest pain,  Negative for flu, cxr unremarkable, sputum sample pending collection, mrsa screen pending collection Troponin negative, Symptom could be from substance abuse, but will empirically treat with zithromax for possible bronchitis,   Leukocytosis: reactive +/- dehydration ua /cxr no infection, blood culture pending, on ivf, empirically abx  Sepsis? Not sure if patient has sepsis, so far no source of infection identified, except possible bronchitis or viral gastroenteritis On ivf, empirica abx  Hypokalemia: replace k, check mag  Elevated fasting blood glucose: will check a1c  HTN/hypertension urgency;  bp 206/135 on presentation, from stress and cocaine? Be better, continue home bp meds, avoid betablocker due to cocaine use  Polysubstance abuse: chronic problem per chart reveiw, uds + opitaes, cocaine, THC, patient states she does not use cocaine, she admit to using THC. Education provided  Code Status: full  Family Communication: patient   Disposition Plan: home in 1-2 days pending symptom  improvement   Consultants:  none  Procedures:  none  Antibiotics:  zitrhomax   Objective: BP (!) 142/76 (BP Location: Right Arm)   Pulse 86   Temp 98.8 F (37.1 C)   Resp 19   Ht 5\' 3"  (1.6 m)   Wt 73 kg (161 lb)   SpO2 100%   BMI 28.52 kg/m   Intake/Output Summary (Last 24 hours) at 05/22/16 1211 Last data filed at 05/22/16 0648  Gross per 24 hour  Intake             3735 ml  Output                0 ml  Net             3735 ml   Filed Weights   05/21/16 2138 05/22/16 0241  Weight: 72.6 kg (160 lb) 73 kg (161 lb)    Exam:   General:  Crying, want to eat  Cardiovascular: RRR  Respiratory: CTABL  Abdomen: Soft/ND/NT, positive BS  Musculoskeletal: No Edema  Neuro: aaox3  Data Reviewed: Basic Metabolic Panel:  Recent Labs Lab 05/21/16 2249 05/22/16 0450  NA 136 137  K 2.6* 3.1*  CL 100* 106  CO2 22 21*  GLUCOSE 189* 127*  BUN 12 10  CREATININE 0.56 0.59  CALCIUM 10.1 8.6*   Liver Function Tests:  Recent Labs Lab 05/21/16 2249  AST 29  ALT 28  ALKPHOS 67  BILITOT 0.9  PROT 9.0*  ALBUMIN 5.2*    Recent Labs Lab 05/21/16 2249  LIPASE 18   No results for input(s): AMMONIA in the last 168 hours. CBC:  Recent Labs Lab 05/21/16 2249 05/22/16 0450  WBC 21.6* 23.1*  NEUTROABS 18.8*  --  HGB 15.8* 12.9  HCT 44.8 38.6  MCV 76.3* 79.6  PLT 331 270   Cardiac Enzymes:    Recent Labs Lab 05/21/16 2249 05/22/16 0450 05/22/16 0844  TROPONINI <0.03 0.03* <0.03   BNP (last 3 results) No results for input(s): BNP in the last 8760 hours.  ProBNP (last 3 results) No results for input(s): PROBNP in the last 8760 hours.  CBG:  Recent Labs Lab 05/22/16 0750  GLUCAP 100*    No results found for this or any previous visit (from the past 240 hour(s)).   Studies: Dg Chest 2 View  Result Date: 05/21/2016 CLINICAL DATA:  Chest pain and cough EXAM: CHEST  2 VIEW COMPARISON:  12/17/2015 FINDINGS: Borderline cardiomegaly  with slight uncoiling of the thoracic aorta as before. No pulmonary consolidation, effusion or pneumothorax. No suspicious osseous abnormalities. IMPRESSION: No active cardiopulmonary disease. Electronically Signed   By: Tollie Ethavid  Kwon M.D.   On: 05/21/2016 23:26    Scheduled Meds: . azithromycin  500 mg Intravenous Q24H  . dextromethorphan-guaiFENesin  1 tablet Oral BID  . enoxaparin (LOVENOX) injection  40 mg Subcutaneous Q24H  . lisinopril  10 mg Oral Daily  . nicotine  21 mg Transdermal Daily  . pantoprazole (PROTONIX) IV  40 mg Intravenous Q12H  . potassium chloride  40 mEq Oral Once    Continuous Infusions: . sodium chloride 125 mL/hr at 05/22/16 0400       Robinette Esters MD, PhD  [facility] Pager [phone]. If 7PM-7AM, please contact night-coverage at [email or web address], password [email or web address] 05/22/2016, 12:11 PM  LOS: 0 days

## 2016-05-22 NOTE — Progress Notes (Signed)
Pt admitted from [facility] she was transported by [facility], came in fully alert and oriented, ID bracelet checked self introduced to pt fall prevention plan discussed call light and phone within reach and pt able to demonstrate how to use them, heart monitor hooked in place CCMD notified admission hx not able to de done due busy assignment, will pass it on to the first shift nurse to get it done when ever possible, treatment started will continue to monitor

## 2016-05-23 ENCOUNTER — Observation Stay (HOSPITAL_BASED_OUTPATIENT_CLINIC_OR_DEPARTMENT_OTHER): Payer: Commercial Managed Care - PPO

## 2016-05-23 ENCOUNTER — Observation Stay (HOSPITAL_COMMUNITY): Payer: Commercial Managed Care - PPO

## 2016-05-23 ENCOUNTER — Encounter (HOSPITAL_COMMUNITY): Payer: Self-pay | Admitting: *Deleted

## 2016-05-23 DIAGNOSIS — E876 Hypokalemia: Secondary | ICD-10-CM | POA: Diagnosis not present

## 2016-05-23 DIAGNOSIS — F1910 Other psychoactive substance abuse, uncomplicated: Secondary | ICD-10-CM | POA: Diagnosis not present

## 2016-05-23 DIAGNOSIS — R112 Nausea with vomiting, unspecified: Secondary | ICD-10-CM | POA: Diagnosis not present

## 2016-05-23 DIAGNOSIS — M7989 Other specified soft tissue disorders: Secondary | ICD-10-CM | POA: Diagnosis not present

## 2016-05-23 DIAGNOSIS — G43A0 Cyclical vomiting, not intractable: Secondary | ICD-10-CM | POA: Diagnosis not present

## 2016-05-23 DIAGNOSIS — F17200 Nicotine dependence, unspecified, uncomplicated: Secondary | ICD-10-CM

## 2016-05-23 DIAGNOSIS — I10 Essential (primary) hypertension: Secondary | ICD-10-CM | POA: Diagnosis not present

## 2016-05-23 DIAGNOSIS — R7989 Other specified abnormal findings of blood chemistry: Secondary | ICD-10-CM | POA: Diagnosis not present

## 2016-05-23 LAB — TROPONIN I
Troponin I: <0.03 ng/mL (ref <0.03)
Troponin I: <0.03 ng/mL (ref <0.03)

## 2016-05-23 LAB — ECHOCARDIOGRAM COMPLETE
Height: 63 in
Weight: 2576 oz

## 2016-05-23 LAB — CBC
HEMATOCRIT: 36.3 % (ref 36.0–46.0)
Hemoglobin: 11.8 g/dL — ABNORMAL LOW (ref 12.0–15.0)
MCH: 26.5 pg (ref 26.0–34.0)
MCHC: 32.5 g/dL (ref 30.0–36.0)
MCV: 81.4 fL (ref 78.0–100.0)
Platelets: 223 10*3/uL (ref 150–400)
RBC: 4.46 MIL/uL (ref 3.87–5.11)
RDW: 14.1 % (ref 11.5–15.5)
WBC: 19.2 10*3/uL — ABNORMAL HIGH (ref 4.0–10.5)

## 2016-05-23 LAB — HEPATITIS PANEL, ACUTE
HCV Ab: 0.1 s/co ratio (ref 0.0–0.9)
Hep A IgM: NEGATIVE
Hep B C IgM: NEGATIVE
Hepatitis B Surface Ag: NEGATIVE

## 2016-05-23 LAB — BASIC METABOLIC PANEL
Anion gap: 9 (ref 5–15)
BUN: <5 mg/dL — ABNORMAL LOW (ref 6–20)
CALCIUM: 9.2 mg/dL (ref 8.9–10.3)
CO2: 22 mmol/L (ref 22–32)
CREATININE: 0.62 mg/dL (ref 0.44–1.00)
Chloride: 107 mmol/L (ref 101–111)
GFR calc non Af Amer: >60 mL/min (ref >60)
GLUCOSE: 100 mg/dL — AB (ref 65–99)
Potassium: 3.4 mmol/L — ABNORMAL LOW (ref 3.5–5.1)
Sodium: 138 mmol/L (ref 135–145)

## 2016-05-23 LAB — MAGNESIUM: Magnesium: 1.9 mg/dL (ref 1.7–2.4)

## 2016-05-23 LAB — HEMOGLOBIN A1C
Hgb A1c MFr Bld: 5.9 % — ABNORMAL HIGH (ref 4.8–5.6)
Mean Plasma Glucose: 123 mg/dL

## 2016-05-23 LAB — GLUCOSE, CAPILLARY: Glucose-Capillary: 99 mg/dL (ref 65–99)

## 2016-05-23 LAB — TSH: TSH: 1.261 u[IU]/mL (ref 0.350–4.500)

## 2016-05-23 MED ORDER — GUAIFENESIN ER 600 MG PO TB12: 600.0000 mg | ORAL_TABLET | Freq: Two times a day (BID) | ORAL | 0 refills | Status: DC

## 2016-05-23 MED ORDER — AMLODIPINE BESYLATE 5 MG PO TABS
5.0000 mg | ORAL_TABLET | Freq: Every day | ORAL | Status: DC
  Administered 2016-05-23: 5 mg via ORAL
  Filled 2016-05-23: qty 1

## 2016-05-23 MED ORDER — HYDRALAZINE HCL 20 MG/ML IJ SOLN
10.0000 mg | INTRAMUSCULAR | Status: DC | PRN
  Administered 2016-05-23 – 2016-05-24 (×3): 10 mg via INTRAVENOUS
  Filled 2016-05-23 (×2): qty 1

## 2016-05-23 MED ORDER — CHLORHEXIDINE GLUCONATE CLOTH 2 % EX PADS
6.0000 | MEDICATED_PAD | Freq: Every day | CUTANEOUS | Status: DC
  Administered 2016-05-23 – 2016-05-24 (×2): 6 via TOPICAL

## 2016-05-23 MED ORDER — KETOROLAC TROMETHAMINE 15 MG/ML IJ SOLN
7.5000 mg | Freq: Four times a day (QID) | INTRAMUSCULAR | Status: DC | PRN
  Administered 2016-05-23 – 2016-05-24 (×2): 7.5 mg via INTRAVENOUS
  Filled 2016-05-23 (×2): qty 1

## 2016-05-23 MED ORDER — DOXYCYCLINE HYCLATE 100 MG PO CAPS: 100.0000 mg | ORAL_CAPSULE | Freq: Two times a day (BID) | ORAL | 0 refills | Status: AC

## 2016-05-23 MED ORDER — AMLODIPINE BESYLATE 5 MG PO TABS: 5.0000 mg | ORAL_TABLET | Freq: Every day | ORAL | 0 refills | Status: DC

## 2016-05-23 MED ORDER — LISINOPRIL 20 MG PO TABS
20.0000 mg | ORAL_TABLET | Freq: Every day | ORAL | Status: DC
  Administered 2016-05-23: 20 mg via ORAL
  Filled 2016-05-23: qty 1

## 2016-05-23 MED ORDER — LISINOPRIL 20 MG PO TABS: 20.0000 mg | ORAL_TABLET | Freq: Every day | ORAL | 0 refills | Status: DC

## 2016-05-23 MED ORDER — POTASSIUM CHLORIDE CRYS ER 20 MEQ PO TBCR
40.0000 meq | EXTENDED_RELEASE_TABLET | Freq: Once | ORAL | Status: AC
  Administered 2016-05-23: 40 meq via ORAL

## 2016-05-23 MED ORDER — GUAIFENESIN-DM 100-10 MG/5ML PO SYRP
5.0000 mL | ORAL_SOLUTION | ORAL | Status: DC | PRN
  Administered 2016-05-23: 5 mL via ORAL
  Filled 2016-05-23: qty 5

## 2016-05-23 MED ORDER — FLUTICASONE PROPIONATE 50 MCG/ACT NA SUSP
2.0000 | Freq: Every day | NASAL | Status: DC
Start: 2016-05-23 — End: 2016-05-24
  Administered 2016-05-23: 2 via NASAL
  Filled 2016-05-23: qty 16

## 2016-05-23 MED ORDER — GUAIFENESIN ER 600 MG PO TB12: 600.0000 mg | ORAL_TABLET | Freq: Two times a day (BID) | ORAL | Status: DC

## 2016-05-23 MED ORDER — FLUTICASONE PROPIONATE 50 MCG/ACT NA SUSP: 2.0000 | Freq: Every day | NASAL | 0 refills | Status: DC

## 2016-05-23 NOTE — Progress Notes (Signed)
VASCULAR LAB PRELIMINARY  PRELIMINARY  PRELIMINARY  PRELIMINARY  Right upper extremity venous duplex completed.    Preliminary report:  There is no DVT or SVT noted in the right upper extremity.  Jannis Atkins, RVT 05/23/2016, 3:28 PM

## 2016-05-23 NOTE — Care Management Note (Signed)
Case Management Note  Patient Details  Name: Theresa Galloway MRN: [id number] Date of Birth: [birth year]  Subjective/Objective:                  Intractable nausea and vomiting Action/Plan: Discharge planning Expected Discharge Date:  05/23/16               Expected Discharge Plan:  Home/Self Care  In-House Referral:     Discharge planning Services  CM Consult  Post Acute Care Choice:    Choice offered to:  Patient  DME Arranged:  N/A DME Agency:  NA  HH Arranged:  NA HH Agency:  NA  Status of Service:  Completed, signed off  If discussed at Long Length of Stay Meetings, dates discussed:    Additional Comments: CM spoke with pt in room and encouraged her to call the number on the back of her insurance card to secure a PCP within the network of her insurance.  CM gave pt [facility] as a resource she can call and secure a PCP.  No other Cm needs were communicated. Yves DillJeffries, Kristoff Coonradt Christine, RN 05/23/2016, 4:06 PM

## 2016-05-23 NOTE — Progress Notes (Signed)
PROGRESS NOTE  [facility] WJX:[id number] DOB: [birth year] DOA: 05/21/2016 PCP: No PCP Per Patient  HPI/Recap of past 24 hours:  She report continue n/v, does not want to be discharged  Assessment/Plan: Principal Problem:   Intractable nausea and vomiting Active Problems:   Hypertension   Chest pain   Abdominal pain   Sepsis (HCC)   Hypokalemia   Cough   Flu-like symptoms  N/V/abdominal pain, with h/o same requiring multiple ED visits H/o gastric ulcer, h/o s/p cholecystectomy, h/o abdominal hysterectomy lft and lipase unremarkable with negative Ct ab /pel on 2/6 and 2/9, abdominal exam benign. H/o polysubstance abuse, uds + opitaes, cocaine, THC. Symptom could be from viral gastroenteritis, could be from substance abuse. She want to eat, she refuse clears,  started on full liquid, advance diet as tolerated. Avoid narcotic which could contribute to n/v.  Subjective Fever, cough, chest pain, no fever in the hospital, no convinced she has sepsis Negative for flu, cxr unremarkable, sputum sample pending collection, mrsa screen + Troponin negative, Symptom could be from substance abuse, She is started empirically on zithromax for possible bronchitis, she also report nasal congestion/sinus headache, will start flonase, change abx to doxycycline due to mrsa screen +.  Get head CT due to c/o headache  Leukocytosis: reactive +/- dehydration, vs chronic ua /cxr no infection, blood culture no growth, Lactic acid/procalcitonin unremarkable, she does not have fever. I donot think the patient has sepsis on presentation. She received hydration, and abx Review chart, patient seems to have chronic leukocytosis, labs from 2016 and 2017 all have leukocytosis,  She is advised to follow with hematology for chronic leukocytosis.  HTN/hypertension urgency;  bp 206/135 on presentation, from stress and cocaine?  avoid betablocker due to cocaine use, start norvasc, continue  lisinopril  Hypokalemia: replace k, check mag  Elevated fasting blood glucose: will check a1c   Polysubstance abuse: chronic problem per chart reveiw, uds + opitaes, cocaine, THC, patient states she does not use cocaine, she admit to using THC. Education provided  Code Status: full  Family Communication: patient   Disposition Plan: home, likely on 2/12   Consultants:  none  Procedures:  none  Antibiotics:  zitrhomax   Objective: BP (!) 171/75   Pulse (!) 53   Temp 98.7 F (37.1 C) (Oral)   Resp 16   Ht 5\' 3"  (1.6 m)   Wt 73 kg (161 lb)   SpO2 100%   BMI 28.52 kg/m   Intake/Output Summary (Last 24 hours) at 05/23/16 1604 Last data filed at 05/23/16 1203  Gross per 24 hour  Intake          2947.92 ml  Output                0 ml  Net          2947.92 ml   Filed Weights   05/21/16 2138 05/22/16 0241  Weight: 72.6 kg (160 lb) 73 kg (161 lb)    Exam:   General:  Crying, want to eat  Cardiovascular: RRR  Respiratory: CTABL  Abdomen: Soft/ND/NT, positive BS  Musculoskeletal: No Edema  Neuro: aaox3  Data Reviewed: Basic Metabolic Panel:  Recent Labs Lab 05/21/16 2249 05/22/16 0450 05/23/16 0309  NA 136 137 138  K 2.6* 3.1* 3.4*  CL 100* 106 107  CO2 22 21* 22  GLUCOSE 189* 127* 100*  BUN 12 10 <5*  CREATININE 0.56 0.59 0.62  CALCIUM 10.1 8.6* 9.2  MG  --   --  1.9   Liver Function Tests:  Recent Labs Lab 05/21/16 2249  AST 29  ALT 28  ALKPHOS 67  BILITOT 0.9  PROT 9.0*  ALBUMIN 5.2*    Recent Labs Lab 05/21/16 2249  LIPASE 18   No results for input(s): AMMONIA in the last 168 hours. CBC:  Recent Labs Lab 05/21/16 2249 05/22/16 0450 05/23/16 0309  WBC 21.6* 23.1* 19.2*  NEUTROABS 18.8*  --   --   HGB 15.8* 12.9 11.8*  HCT 44.8 38.6 36.3  MCV 76.3* 79.6 81.4  PLT 331 270 223   Cardiac Enzymes:    Recent Labs Lab 05/22/16 0844 05/22/16 1513 05/22/16 2030 05/23/16 0309 05/23/16 0826  TROPONINI <0.03  0.06* 0.03* <0.03 <0.03   BNP (last 3 results) No results for input(s): BNP in the last 8760 hours.  ProBNP (last 3 results) No results for input(s): PROBNP in the last 8760 hours.  CBG:  Recent Labs Lab 05/22/16 0750 05/23/16 0735  GLUCAP 100* 99    Recent Results (from the past 240 hour(s))  Culture, blood (x 2)     Status: None (Preliminary result)   Collection Time: 05/22/16  4:43 AM  Result Value Ref Range Status   Specimen Description BLOOD LEFT FOREARM  Final   Special Requests BOTTLES DRAWN AEROBIC AND ANAEROBIC 10CC  Final   Culture NO GROWTH 1 DAY  Final   Report Status PENDING  Incomplete  Culture, blood (x 2)     Status: None (Preliminary result)   Collection Time: 05/22/16  4:50 AM  Result Value Ref Range Status   Specimen Description BLOOD RIGHT FOREARM  Final   Special Requests BOTTLES DRAWN AEROBIC AND ANAEROBIC 10CC  Final   Culture NO GROWTH 1 DAY  Final   Report Status PENDING  Incomplete  MRSA PCR Screening     Status: Abnormal   Collection Time: 05/22/16 12:40 PM  Result Value Ref Range Status   MRSA by PCR POSITIVE (A) NEGATIVE Final    Comment:        The GeneXpert MRSA Assay (FDA approved for NASAL specimens only), is one component of a comprehensive MRSA colonization surveillance program. It is not intended to diagnose MRSA infection nor to guide or monitor treatment for MRSA infections. RESULT CALLED TO, READ BACK BY AND VERIFIED WITH: V.SMITH RN AT 1445 05/22/16 BY A.DAVIS      Studies: No results found.  Scheduled Meds: . amLODipine  5 mg Oral Daily  . Chlorhexidine Gluconate Cloth  6 each Topical Q0600  . dextromethorphan-guaiFENesin  1 tablet Oral BID  . enoxaparin (LOVENOX) injection  40 mg Subcutaneous Q24H  . fluticasone  2 spray Each Nare Daily  . lisinopril  20 mg Oral Daily  . mupirocin ointment   Nasal BID  . nicotine  21 mg Transdermal Daily  . pantoprazole (PROTONIX) IV  40 mg Intravenous Q12H  . potassium chloride   40 mEq Oral Once    Continuous Infusions:      Ishika Chesterfield MD, PhD  [facility] Pager [phone]. If 7PM-7AM, please contact night-coverage at [email or web address], password [facility] 05/23/2016, 4:04 PM  LOS: 0 days

## 2016-05-23 NOTE — Discharge Summary (Addendum)
Discharge Summary  [facility] ZOX:[id number]:[id number] DOB: [birth year]  PCP: No PCP Per Patient  Admit date: 05/21/2016 Discharge date: 05/24/2016  Time spent: <2930mins  Recommendations for Outpatient Follow-up:  1. F/u with PMD within a week  for hospital discharge follow up, repeat cbc/bmp at follow up  Discharge Diagnoses:  Active Hospital Problems   Diagnosis Date Noted  . Intractable nausea and vomiting 05/22/2016  . Non-intractable vomiting with nausea   . Substance abuse   . Chest pain 05/22/2016  . Abdominal pain 05/22/2016  . Sepsis (HCC) 05/22/2016  . Hypokalemia 05/22/2016  . Cough 05/22/2016  . Flu-like symptoms 05/22/2016  . Hypertension     Resolved Hospital Problems   Diagnosis Date Noted Date Resolved  No resolved problems to display.    Discharge Condition: stable  Diet recommendation: heart healthy  Filed Weights   05/21/16 2138 05/22/16 0241  Weight: 72.6 kg (160 lb) 73 kg (161 lb)    History of present illness:  PCP: No PCP Per Patient   Patient coming from:  The patient is coming from home.  At baseline, pt is independent for most of ADL.        Chief Complaint: Nausea, vomiting, abdominal pain, fever, cough, chest pain  HPI: Theresa Galloway is a 51 y.o. female with medical history significant of hypertension, hepatitis B, tobacco abuse, who presents with nausea, vomiting, abdominal pain, fever, cough and chest pain.  Patient states that she has been sick for more than one week. She has been having nausea, vomiting, abdominal pain, fever, cough, chest pain. [facility]. She had unremarkable CT of the abdomen and pelvis and workup was unremarkable with normal laboratory studies, including urinalysis per patient, but not tested for Flu. Her symptoms worsened and [facility] was [facility]. Laboratory work yesterday included a white blood cell count of 24.8 with an ANC of 21.3. Her potassium was  noted to be 3. Lipase was 19. Repeat CT of the abdomen and pelvis was unremarkable. She was discharged with diagnosis of gastritis with hemorrhage. Pt states that she was given prescription for antibiotics, but does not remember the name.   She states that her symptoms have not improved at all. She continues to have intractable nausea and vomiting and abdominal pain. She vomited 15-20 times today. She saw streaks of blood in the vomitus. Patient has diffused abdominal pain, which is constant, 10 out of 10, nonradiating. Patient does not have diarrhea. Denies symptoms of UTI.  She continues to have cough with yellow colored sputum production, fever, body aches. She also has chest pain, which is located in the central chest, constant, 5 out of 10 in severity, pleuritic, aggravated by deep breath. Denies tenderness over calf areas. Currently patient does not have SOB. She also has lower back pain. Patient states that she has chronic mild right arm numbness which has been going on for more than 6 month, not changed today. Patient does not have facial droop, vision change, hearing loss, unilateral weakness.  ED Course: pt was found to have WBC 21.6, negative troponin, lipase 18, potassium 2.6, creatinine normal, temperature normal, elevated blood pressure 237/113-->166/100, tachycardia, tachypnea, oxygen saturation 95% on room air, negative chest x-ray. Patient is placed on MedSurg bed for observation.   Hospital Course:  Principal Problem:   Intractable nausea and vomiting Active Problems:   Hypertension   Chest pain   Abdominal pain   Sepsis (HCC)  Hypokalemia   Cough   Flu-like symptoms   Non-intractable vomiting with nausea   Substance abuse   N/V/abdominal pain, with h/o same requiring multiple ED visits H/o gastric ulcer, h/o s/p cholecystectomy, h/o abdominal hysterectomy lft and lipase unremarkable with negative Ct ab /pel on 2/6 and 2/9 H/o polysubstance abuse, uds + opitaes,  cocaine, THC. Symptom could be from viral gastroenteritis, could be from substance abuse. She want to eat, tolerate diet advancement, she is discharged home to follow up with primary care physician.  Subjective Fever, cough, chest pain, no fever in the hospital Negative for flu, cxr unremarkable, cough is nonproductive, mrsa screen + Troponin negative, Symptom could be from substance abuse, but also concern for bronchitis, sinusitis, she is treated with zithromax first, due to positive MRSA screening, change abx to doxycycline and continue to finish total of 10 days treatment. She is also provided flonase. Ct head + bilateral paranasal sinusitis, no acute intracranial abnormality.  Leukocytosis: reactive +/- dehydration ua /cxr no infection, blood culture no growth, Lactic acid/procalcitonin unremarkable, she does not have fever. I donot think the patient has sepsis on presentation. She received hydration, and abx Review chart, patient seems to have chronic leukocytosis, labs from 2016 and 2017 all have leukocytosis,  She is advised to follow with primary care physician and consider hematology consult for chronic leukocytosis.   Hypokalemia: k2.6 on admission,  replace k, mag 1.9   HTN/hypertension urgency;  bp 206/135 on presentation, from stress and cocaine? Be better, continue home bp meds, avoid betablocker due to cocaine use She is discharged home with norvasc/lisiniopril, follow up with pmd for blood pressure meds adjustment  Elevated fasting blood glucose:  AM blood sugar range from 100-127 this hospitalization  a1c 5.8  Polysubstance abuse: chronic problem per chart reveiw, uds + opitaes, cocaine, THC, patient states she does not use cocaine, she admits to using THC. Education provided  Code Status: full  Family Communication: patient   Disposition Plan: home on 2/12   Consultants:  Care manager to assist on primary care follow  up  Procedures:  none  Antibiotics:  zitrhomax then doxycyclin    Discharge Exam: BP (!) 168/85   Pulse (!) 56   Temp 98.3 F (36.8 C) (Oral)   Resp 17   Ht 5\' 3"  (1.6 m)   Wt 73 kg (161 lb)   SpO2 98%   BMI 28.52 kg/m   General: NAD Cardiovascular: RRR Respiratory: CTABL  Abdomen: Soft/ND/NT, positive BS  Musculoskeletal: No Edema  Neuro: aaox3  Discharge Instructions You were cared for by a hospitalist during your hospital stay. If you have any questions about your discharge medications or the care you received while you were in the hospital after you are discharged, you can call the unit and asked to speak with the hospitalist on call if the hospitalist that took care of you is not available. Once you are discharged, your primary care physician will handle any further medical issues. Please note that NO REFILLS for any discharge medications will be authorized once you are discharged, as it is imperative that you return to your primary care physician (or establish a relationship with a primary care physician if you do not have one) for your aftercare needs so that they can reassess your need for medications and monitor your lab values.  Discharge Instructions    Diet - low sodium heart healthy    Complete by:  As directed    Increase activity slowly  Complete by:  As directed      Allergies as of 05/24/2016   No Known Allergies     Medication List    STOP taking these medications   ciprofloxacin 500 MG tablet Commonly known as:  CIPRO   hydrochlorothiazide 25 MG tablet Commonly known as:  HYDRODIURIL   HYDROcodone-acetaminophen 5-325 MG tablet Commonly known as:  NORCO/VICODIN   ondansetron 8 MG disintegrating tablet Commonly known as:  ZOFRAN ODT     TAKE these medications   amLODipine 10 MG tablet Commonly known as:  NORVASC Take 1 tablet (10 mg total) by mouth daily. Start taking on:  05/25/2016   aspirin EC 81 MG tablet Take 1 tablet (81  mg total) by mouth daily.   doxycycline 100 MG capsule Commonly known as:  VIBRAMYCIN Take 1 capsule (100 mg total) by mouth 2 (two) times daily. What changed:  additional instructions   fluticasone 50 MCG/ACT nasal spray Commonly known as:  FLONASE Place 2 sprays into both nostrils daily.   guaiFENesin 600 MG 12 hr tablet Commonly known as:  MUCINEX Take 1 tablet (600 mg total) by mouth 2 (two) times daily.   lisinopril 40 MG tablet Commonly known as:  PRINIVIL,ZESTRIL Take 1 tablet (40 mg total) by mouth daily. Start taking on:  05/25/2016 What changed:  medication strength  how much to take   pantoprazole 40 MG tablet Commonly known as:  PROTONIX Take 40 mg by mouth daily.      No Known Allergies Follow-up Information    establish care with a primary care physician,refer to hematology for chronic leukocytosis Follow up.   Why:  repeat cbc/bmp at follow up, please continue to work with your primary care physician for blood pressure control as well. follow up with your primary care doctor for sinusitis .       [facility] Follow up.   Why:  You can secure a primary care physician by calling the number on the back of your insurance card for an "in-network" provider OR call [facility] and follow the prompts to get a primary care physician. Contact information: [phone]           The results of significant diagnostics from this hospitalization (including imaging, microbiology, ancillary and laboratory) are listed below for reference.    Significant Diagnostic Studies: Dg Chest 2 View  Result Date: 05/21/2016 CLINICAL DATA:  Chest pain and cough EXAM: CHEST  2 VIEW COMPARISON:  12/17/2015 FINDINGS: Borderline cardiomegaly with slight uncoiling of the thoracic aorta as before. No pulmonary consolidation, effusion or pneumothorax. No suspicious osseous abnormalities. IMPRESSION: No active cardiopulmonary disease. Electronically Signed   By: Tollie Eth M.D.    On: 05/21/2016 23:26   Ct Head Wo Contrast  Result Date: 05/23/2016 CLINICAL DATA:  Headache, nausea EXAM: CT HEAD WITHOUT CONTRAST TECHNIQUE: Contiguous axial images were obtained from the base of the skull through the vertex without intravenous contrast. COMPARISON:  10/03/2014 FINDINGS: Brain: No evidence of acute infarction, hemorrhage, hydrocephalus, extra-axial collection or mass lesion/mass effect. Vascular: No hyperdense vessel or unexpected calcification. Skull: Normal. Negative for fracture or focal lesion. Sinuses/Orbits: Mucosal thickening/ partial opacification of the bilateral ethmoid, sphenoid, and maxillary sinuses. Mastoid air cells are clear. Other: None. IMPRESSION: No evidence of acute intracranial abnormality. Partial opacification of the bilateral paranasal sinuses, as above. Electronically Signed   By: Charline Bills M.D.   On: 05/23/2016 17:44    Microbiology: Recent Results (from the past 240 hour(s))  Culture, blood (  x 2)     Status: None (Preliminary result)   Collection Time: 05/22/16  4:43 AM  Result Value Ref Range Status   Specimen Description BLOOD LEFT FOREARM  Final   Special Requests BOTTLES DRAWN AEROBIC AND ANAEROBIC 10CC  Final   Culture NO GROWTH 1 DAY  Final   Report Status PENDING  Incomplete  Culture, blood (x 2)     Status: None (Preliminary result)   Collection Time: 05/22/16  4:50 AM  Result Value Ref Range Status   Specimen Description BLOOD RIGHT FOREARM  Final   Special Requests BOTTLES DRAWN AEROBIC AND ANAEROBIC 10CC  Final   Culture NO GROWTH 1 DAY  Final   Report Status PENDING  Incomplete  MRSA PCR Screening     Status: Abnormal   Collection Time: 05/22/16 12:40 PM  Result Value Ref Range Status   MRSA by PCR POSITIVE (A) NEGATIVE Final    Comment:        The GeneXpert MRSA Assay (FDA approved for NASAL specimens only), is one component of a comprehensive MRSA colonization surveillance program. It is not intended to diagnose  MRSA infection nor to guide or monitor treatment for MRSA infections. RESULT CALLED TO, READ BACK BY AND VERIFIED WITH: V.SMITH RN AT 1445 05/22/16 BY A.DAVIS      Labs: Basic Metabolic Panel:  Recent Labs Lab 05/21/16 2249 05/22/16 0450 05/23/16 0309 05/24/16 0633  NA 136 137 138 136  K 2.6* 3.1* 3.4* 3.0*  CL 100* 106 107 101  CO2 22 21* 22 19*  GLUCOSE 189* 127* 100* 102*  BUN 12 10 <5* 6  CREATININE 0.56 0.59 0.62 0.63  CALCIUM 10.1 8.6* 9.2 10.0  MG  --   --  1.9 1.9   Liver Function Tests:  Recent Labs Lab 05/21/16 2249  AST 29  ALT 28  ALKPHOS 67  BILITOT 0.9  PROT 9.0*  ALBUMIN 5.2*    Recent Labs Lab 05/21/16 2249  LIPASE 18   No results for input(s): AMMONIA in the last 168 hours. CBC:  Recent Labs Lab 05/21/16 2249 05/22/16 0450 05/23/16 0309 05/24/16 0633  WBC 21.6* 23.1* 19.2* 14.3*  NEUTROABS 18.8*  --   --  10.2*  HGB 15.8* 12.9 11.8* 14.8  HCT 44.8 38.6 36.3 43.4  MCV 76.3* 79.6 81.4 79.1  PLT 331 270 223 264   Cardiac Enzymes:  Recent Labs Lab 05/22/16 0844 05/22/16 1513 05/22/16 2030 05/23/16 0309 05/23/16 0826  TROPONINI <0.03 0.06* 0.03* <0.03 <0.03   BNP: BNP (last 3 results) No results for input(s): BNP in the last 8760 hours.  ProBNP (last 3 results) No results for input(s): PROBNP in the last 8760 hours.  CBG:  Recent Labs Lab 05/22/16 0750 05/23/16 0735 05/24/16 0813  GLUCAP 100* 99 96       Signed:  Taliana Mersereau MD, PhD  [facility] 05/24/2016, 10:59 AM

## 2016-05-24 DIAGNOSIS — I10 Essential (primary) hypertension: Secondary | ICD-10-CM | POA: Diagnosis not present

## 2016-05-24 DIAGNOSIS — F1910 Other psychoactive substance abuse, uncomplicated: Secondary | ICD-10-CM | POA: Diagnosis not present

## 2016-05-24 DIAGNOSIS — G43A0 Cyclical vomiting, not intractable: Secondary | ICD-10-CM | POA: Diagnosis not present

## 2016-05-24 DIAGNOSIS — E876 Hypokalemia: Secondary | ICD-10-CM | POA: Diagnosis not present

## 2016-05-24 LAB — CBC WITH DIFFERENTIAL/PLATELET
BASOS PCT: 0 %
Basophils Absolute: 0.1 10*3/uL (ref 0.0–0.1)
EOS ABS: 0 10*3/uL (ref 0.0–0.7)
Eosinophils Relative: 0 %
HCT: 43.4 % (ref 36.0–46.0)
Hemoglobin: 14.8 g/dL (ref 12.0–15.0)
Lymphocytes Relative: 22 %
Lymphs Abs: 3.2 10*3/uL (ref 0.7–4.0)
MCH: 27 pg (ref 26.0–34.0)
MCHC: 34.1 g/dL (ref 30.0–36.0)
MCV: 79.1 fL (ref 78.0–100.0)
MONO ABS: 1 10*3/uL (ref 0.1–1.0)
MONOS PCT: 7 %
Neutro Abs: 10.2 10*3/uL — ABNORMAL HIGH (ref 1.7–7.7)
Neutrophils Relative %: 71 %
Platelets: 264 10*3/uL (ref 150–400)
RBC: 5.49 MIL/uL — ABNORMAL HIGH (ref 3.87–5.11)
RDW: 13.9 % (ref 11.5–15.5)
WBC: 14.3 10*3/uL — ABNORMAL HIGH (ref 4.0–10.5)

## 2016-05-24 LAB — BASIC METABOLIC PANEL
Anion gap: 16 — ABNORMAL HIGH (ref 5–15)
BUN: 6 mg/dL (ref 6–20)
CALCIUM: 10 mg/dL (ref 8.9–10.3)
CO2: 19 mmol/L — AB (ref 22–32)
CREATININE: 0.63 mg/dL (ref 0.44–1.00)
Chloride: 101 mmol/L (ref 101–111)
GFR calc non Af Amer: >60 mL/min (ref >60)
Glucose, Bld: 102 mg/dL — ABNORMAL HIGH (ref 65–99)
Potassium: 3 mmol/L — ABNORMAL LOW (ref 3.5–5.1)
Sodium: 136 mmol/L (ref 135–145)

## 2016-05-24 LAB — MAGNESIUM: MAGNESIUM: 1.9 mg/dL (ref 1.7–2.4)

## 2016-05-24 LAB — HEMOGLOBIN A1C
HEMOGLOBIN A1C: 5.8 % — AB (ref 4.8–5.6)
Mean Plasma Glucose: 120 mg/dL

## 2016-05-24 LAB — GLUCOSE, CAPILLARY: GLUCOSE-CAPILLARY: 96 mg/dL (ref 65–99)

## 2016-05-24 MED ORDER — ASPIRIN EC 81 MG PO TBEC: 81.0000 mg | DELAYED_RELEASE_TABLET | Freq: Every day | ORAL | 0 refills | Status: AC

## 2016-05-24 MED ORDER — POTASSIUM CHLORIDE CRYS ER 20 MEQ PO TBCR
40.0000 meq | EXTENDED_RELEASE_TABLET | Freq: Once | ORAL | Status: AC
  Administered 2016-05-24: 40 meq via ORAL
  Filled 2016-05-24: qty 2

## 2016-05-24 MED ORDER — LISINOPRIL 40 MG PO TABS: 40.0000 mg | ORAL_TABLET | Freq: Every day | ORAL | 0 refills | Status: AC

## 2016-05-24 MED ORDER — CLONIDINE HCL 0.1 MG PO TABS
0.1000 mg | ORAL_TABLET | Freq: Once | ORAL | Status: AC
  Administered 2016-05-24: 0.1 mg via ORAL
  Filled 2016-05-24: qty 1

## 2016-05-24 MED ORDER — PANTOPRAZOLE SODIUM 40 MG PO TBEC
40.0000 mg | DELAYED_RELEASE_TABLET | Freq: Two times a day (BID) | ORAL | Status: DC
  Administered 2016-05-24: 40 mg via ORAL
  Filled 2016-05-24: qty 1

## 2016-05-24 MED ORDER — AMLODIPINE BESYLATE 10 MG PO TABS
10.0000 mg | ORAL_TABLET | Freq: Every day | ORAL | Status: DC
  Administered 2016-05-24: 10 mg via ORAL
  Filled 2016-05-24: qty 1

## 2016-05-24 MED ORDER — LISINOPRIL 40 MG PO TABS
40.0000 mg | ORAL_TABLET | Freq: Every day | ORAL | Status: DC
  Administered 2016-05-24: 40 mg via ORAL
  Filled 2016-05-24: qty 1

## 2016-05-24 MED ORDER — AMLODIPINE BESYLATE 10 MG PO TABS: 10.0000 mg | ORAL_TABLET | Freq: Every day | ORAL | 0 refills | Status: AC

## 2016-05-24 NOTE — Progress Notes (Addendum)
Pt's BP on high side. PRN hydralazine given twice with minimal responsiveness. BP is currently 207/73. NP notified. Will continue to monitor.  NP ordered Clonidine, given and re-assessed.  At 7: 42 to be 168/85. Care handed over to the day RN.

## 2016-05-24 NOTE — Progress Notes (Signed)
Patient given discharge paper work and verbalized discharge instructions.  PIV removed area CDI.   Discharge vitals Vitals:   05/24/16 0601 05/24/16 0742  BP: (!) 207/73 (!) 168/85  Pulse:  (!) 56  Resp:    Temp:     Discharge meds Allergies as of 05/24/2016   No Known Allergies     Medication List    STOP taking these medications   ciprofloxacin 500 MG tablet Commonly known as:  CIPRO   hydrochlorothiazide 25 MG tablet Commonly known as:  HYDRODIURIL   HYDROcodone-acetaminophen 5-325 MG tablet Commonly known as:  NORCO/VICODIN   ondansetron 8 MG disintegrating tablet Commonly known as:  ZOFRAN ODT     TAKE these medications   amLODipine 10 MG tablet Commonly known as:  NORVASC Take 1 tablet (10 mg total) by mouth daily. Start taking on:  05/25/2016   aspirin EC 81 MG tablet Take 1 tablet (81 mg total) by mouth daily.   doxycycline 100 MG capsule Commonly known as:  VIBRAMYCIN Take 1 capsule (100 mg total) by mouth 2 (two) times daily. What changed:  additional instructions   fluticasone 50 MCG/ACT nasal spray Commonly known as:  FLONASE Place 2 sprays into both nostrils daily.   guaiFENesin 600 MG 12 hr tablet Commonly known as:  MUCINEX Take 1 tablet (600 mg total) by mouth 2 (two) times daily.   lisinopril 40 MG tablet Commonly known as:  PRINIVIL,ZESTRIL Take 1 tablet (40 mg total) by mouth daily. Start taking on:  05/25/2016 What changed:  medication strength  how much to take   pantoprazole 40 MG tablet Commonly known as:  PROTONIX Take 40 mg by mouth daily.      Patient escorted out by patient representative.

## 2016-05-27 LAB — CULTURE, BLOOD (ROUTINE X 2)
Culture: NO GROWTH
Culture: NO GROWTH

## 2016-10-04 ENCOUNTER — Emergency Department (HOSPITAL_BASED_OUTPATIENT_CLINIC_OR_DEPARTMENT_OTHER): Payer: Commercial Managed Care - PPO

## 2016-10-04 ENCOUNTER — Encounter (HOSPITAL_BASED_OUTPATIENT_CLINIC_OR_DEPARTMENT_OTHER): Payer: Self-pay | Admitting: *Deleted

## 2016-10-04 ENCOUNTER — Emergency Department (HOSPITAL_BASED_OUTPATIENT_CLINIC_OR_DEPARTMENT_OTHER)
Admission: EM | Admit: 2016-10-04 | Discharge: 2016-10-04 | Disposition: A | Discharge status: Home / Self Care | Payer: Commercial Managed Care - PPO | Attending: Emergency Medicine | Admitting: Emergency Medicine

## 2016-10-04 DIAGNOSIS — R05 Cough: Secondary | ICD-10-CM | POA: Diagnosis not present

## 2016-10-04 DIAGNOSIS — F17210 Nicotine dependence, cigarettes, uncomplicated: Secondary | ICD-10-CM | POA: Insufficient documentation

## 2016-10-04 DIAGNOSIS — Z7982 Long term (current) use of aspirin: Secondary | ICD-10-CM | POA: Insufficient documentation

## 2016-10-04 DIAGNOSIS — R059 Cough, unspecified: Secondary | ICD-10-CM

## 2016-10-04 DIAGNOSIS — I10 Essential (primary) hypertension: Secondary | ICD-10-CM | POA: Insufficient documentation

## 2016-10-04 DIAGNOSIS — Z79899 Other long term (current) drug therapy: Secondary | ICD-10-CM | POA: Insufficient documentation

## 2016-10-04 DIAGNOSIS — R079 Chest pain, unspecified: Secondary | ICD-10-CM | POA: Insufficient documentation

## 2016-10-04 LAB — CBC WITH DIFFERENTIAL/PLATELET
BASOS ABS: 0.1 10*3/uL (ref 0.0–0.1)
BASOS PCT: 0 %
Eosinophils Absolute: 0.2 10*3/uL (ref 0.0–0.7)
Eosinophils Relative: 1 %
HEMATOCRIT: 41.2 % (ref 36.0–46.0)
HEMOGLOBIN: 14.3 g/dL (ref 12.0–15.0)
Lymphocytes Relative: 22 %
Lymphs Abs: 3.1 10*3/uL (ref 0.7–4.0)
MCH: 27.7 pg (ref 26.0–34.0)
MCHC: 34.7 g/dL (ref 30.0–36.0)
MCV: 79.7 fL (ref 78.0–100.0)
MONO ABS: 0.9 10*3/uL (ref 0.1–1.0)
Monocytes Relative: 6 %
NEUTROS ABS: 9.8 10*3/uL — AB (ref 1.7–7.7)
NEUTROS PCT: 71 %
Platelets: 280 10*3/uL (ref 150–400)
RBC: 5.17 MIL/uL — ABNORMAL HIGH (ref 3.87–5.11)
RDW: 14.6 % (ref 11.5–15.5)
WBC: 14.1 10*3/uL — ABNORMAL HIGH (ref 4.0–10.5)

## 2016-10-04 LAB — COMPREHENSIVE METABOLIC PANEL
ALBUMIN: 4.1 g/dL (ref 3.5–5.0)
ALK PHOS: 74 U/L (ref 38–126)
ALT: 17 U/L (ref 14–54)
ANION GAP: 8 (ref 5–15)
AST: 20 U/L (ref 15–41)
BUN: 11 mg/dL (ref 6–20)
CALCIUM: 9.3 mg/dL (ref 8.9–10.3)
CHLORIDE: 107 mmol/L (ref 101–111)
CO2: 23 mmol/L (ref 22–32)
Creatinine, Ser: 0.75 mg/dL (ref 0.44–1.00)
GFR calc Af Amer: >60 mL/min (ref >60)
GFR calc non Af Amer: >60 mL/min (ref >60)
GLUCOSE: 92 mg/dL (ref 65–99)
POTASSIUM: 3.3 mmol/L — AB (ref 3.5–5.1)
SODIUM: 138 mmol/L (ref 135–145)
Total Bilirubin: 0.4 mg/dL (ref 0.3–1.2)
Total Protein: 7.2 g/dL (ref 6.5–8.1)

## 2016-10-04 LAB — TROPONIN I

## 2016-10-04 LAB — D-DIMER, QUANTITATIVE: D-Dimer, Quant: 0.27 ug/mL-FEU (ref 0.00–0.50)

## 2016-10-04 MED ORDER — MORPHINE SULFATE (PF) 4 MG/ML IV SOLN: 4.0000 mg | Freq: Once | INTRAVENOUS | Status: DC

## 2016-10-04 MED ORDER — BENZONATATE 100 MG PO CAPS: 100.0000 mg | ORAL_CAPSULE | Freq: Three times a day (TID) | ORAL | 0 refills | Status: DC

## 2016-10-04 MED ORDER — CYCLOBENZAPRINE HCL 10 MG PO TABS: 10.0000 mg | ORAL_TABLET | Freq: Two times a day (BID) | ORAL | 0 refills | Status: DC | PRN

## 2016-10-04 MED ORDER — IBUPROFEN 400 MG PO TABS
600.0000 mg | ORAL_TABLET | Freq: Once | ORAL | Status: AC
  Administered 2016-10-04: 600 mg via ORAL
  Filled 2016-10-04: qty 1

## 2016-10-04 NOTE — Discharge Instructions (Signed)
Please take the cough medicine and muscle relaxant to help with your right sided chest discomfort. You did not have evidence of pneumonia today on your x-ray. Please follow-up with a primary care physician for further management of your symptoms. If any symptoms change or worsen, please return to the nearest emergency department.

## 2016-10-04 NOTE — ED Triage Notes (Signed)
Pt reports cough [phone]. Denies fever, sob, n/v/d, nasal congestion. Reports chest pain with cough.

## 2016-10-04 NOTE — ED Provider Notes (Signed)
[facility] Provider Note   CSN: [id number] Arrival date & time: 10/04/16  8413     History   Chief Complaint Chief Complaint  Patient presents with  . Cough    HPI Theresa Galloway is a 51 y.o. female.  The history is provided by the patient and medical records. No language interpreter was used.  Cough  This is a new problem. The current episode started more than 1 week ago. The problem occurs constantly. The problem has not changed since onset.The cough is productive of sputum. There has been no fever (subjective). Associated symptoms include chest pain, chills and shortness of breath. Pertinent negatives include no sweats, no headaches, no rhinorrhea and no wheezing. She has tried nothing for the symptoms. The treatment provided no relief. She is a smoker. Her past medical history does not include COPD or asthma.    Past Medical History:  Diagnosis Date  . Hepatitis B   . Hypertension     Patient Active Problem List   Diagnosis Date Noted  . Non-intractable vomiting with nausea   . Substance abuse   . Intractable nausea and vomiting 05/22/2016  . Chest pain 05/22/2016  . Abdominal pain 05/22/2016  . Sepsis (HCC) 05/22/2016  . Hypokalemia 05/22/2016  . Cough 05/22/2016  . Flu-like symptoms 05/22/2016  . Hypertension     Past Surgical History:  Procedure Laterality Date  . ABDOMINAL HYSTERECTOMY    . CHOLECYSTECTOMY      OB History    No data available       Home Medications    Prior to Admission medications   Medication Sig Start Date End Date Taking? Authorizing Provider  aspirin EC 81 MG tablet Take 1 tablet (81 mg total) by mouth daily. 05/24/16  Yes Albertine Grates, MD  lisinopril (PRINIVIL,ZESTRIL) 40 MG tablet Take 1 tablet (40 mg total) by mouth daily. 05/25/16  Yes Albertine Grates, MD  metoprolol tartrate (LOPRESSOR) 25 MG tablet Take 25 mg by mouth 2 (two) times daily.   Yes [provider]  amLODipine (NORVASC) 10 MG tablet Take 1 tablet  (10 mg total) by mouth daily. 05/25/16   Albertine Grates, MD  fluticasone (FLONASE) 50 MCG/ACT nasal spray Place 2 sprays into both nostrils daily. 05/23/16   Albertine Grates, MD  guaiFENesin (MUCINEX) 600 MG 12 hr tablet Take 1 tablet (600 mg total) by mouth 2 (two) times daily. 05/23/16   Albertine Grates, MD  pantoprazole (PROTONIX) 40 MG tablet Take 40 mg by mouth daily.    [provider]    Family History Family History  Problem Relation Age of Onset  . Gastric cancer Mother   . Obesity Father   . Diabetes Mellitus II Brother   . Kidney disease Brother   . Congestive Heart Failure Brother     Social History Social History  Substance Use Topics  . Smoking status: Current Every Day Smoker    Packs/day: 0.50    Types: Cigarettes  . Smokeless tobacco: Never Used  . Alcohol use Yes     Comment: occ     Allergies   Patient has no known allergies.   Review of Systems Review of Systems  Constitutional: Positive for chills and fever (subjective). Negative for diaphoresis and fatigue.  HENT: Negative for congestion and rhinorrhea.   Eyes: Negative for visual disturbance.  Respiratory: Positive for cough, chest tightness and shortness of breath. Negative for wheezing and stridor.   Cardiovascular: Positive for chest pain. Negative for  palpitations and leg swelling.  Gastrointestinal: Negative for constipation, diarrhea, nausea and vomiting.  Genitourinary: Negative for dysuria and flank pain.  Musculoskeletal: Negative for back pain, neck pain and neck stiffness.  Skin: Negative for rash and wound.  Neurological: Negative for dizziness, light-headedness, numbness and headaches.  Psychiatric/Behavioral: Negative for agitation.  All other systems reviewed and are negative.    Physical Exam Updated Vital Signs BP (!) 156/117 (BP Location: Right Arm) Comment: states she hasn't taken BP meds today  Pulse 81   Temp 98.4 F (36.9 C) (Oral)   Resp 18   Ht 5\' 3"  (1.6 m)   Wt 70.3 kg (155  lb)   SpO2 99%   BMI 27.46 kg/m   Physical Exam  Constitutional: She appears well-developed and well-nourished. No distress.  HENT:  Head: Normocephalic and atraumatic.  Mouth/Throat: Oropharynx is clear and moist. No oropharyngeal exudate.  Eyes: Conjunctivae and EOM are normal. Pupils are equal, round, and reactive to light.  Neck: Normal range of motion. Neck supple.  Cardiovascular: Normal rate, regular rhythm and intact distal pulses.   No murmur heard. Pulmonary/Chest: Effort normal. No stridor. No respiratory distress. She has no wheezes. She has no rales. She exhibits no tenderness.  Abdominal: Soft. She exhibits no distension. There is no tenderness.  Musculoskeletal: She exhibits no edema or tenderness.  Neurological: She is alert. No sensory deficit. She exhibits normal muscle tone.  Skin: Skin is warm and dry. Capillary refill takes less than 2 seconds. No rash noted. She is not diaphoretic. No erythema.  Psychiatric: She has a normal mood and affect.  Nursing note and vitals reviewed.    ED Treatments / Results  Labs (all labs ordered are listed, but only abnormal results are displayed) Labs Reviewed  CBC WITH DIFFERENTIAL/PLATELET - Abnormal; Notable for the following:       Result Value   WBC 14.1 (*)    RBC 5.17 (*)    Neutro Abs 9.8 (*)    All other components within normal limits  COMPREHENSIVE METABOLIC PANEL - Abnormal; Notable for the following:    Potassium 3.3 (*)    All other components within normal limits  TROPONIN I  D-DIMER, QUANTITATIVE (NOT AT [facility])    EKG  EKG Interpretation  Date/Time:  Monday October 04 2016 08:29:26 EDT Ventricular Rate:  64 PR Interval:    QRS Duration: 94 QT Interval:  450 QTC Calculation: 465 R Axis:   5 Text Interpretation:  Sinus rhythm Anterior infarct, old When compared to prior, no significant changes seen.  No STEMI Confirmed by Theda Belfast ([id number]) on 10/04/2016 8:38:09 AM Also confirmed by Theda Belfast  [id number]), editor Misty Stanley [phone])  on 10/04/2016 11:42:35 AM       Radiology Dg Chest 2 View  Result Date: 10/04/2016 CLINICAL DATA:  One month of productive cough, chills, shortness of breath, right-sided chest pain. Current smoker. History of hypertension EXAM: CHEST  2 VIEW COMPARISON:  PA and lateral chest x-ray of May 21, 2016 FINDINGS: The lungs are well-expanded. There is no focal infiltrate. There is no pleural effusion. The heart and pulmonary vascularity are normal. The mediastinum is normal in width. The bony thorax is unremarkable. IMPRESSION: There is no acute cardiopulmonary abnormality. Electronically Signed   By: David  [place] M.D.   On: 10/04/2016 08:17    Procedures Procedures (including critical care time)  Medications Ordered in ED Medications  ibuprofen (ADVIL,MOTRIN) tablet 600 mg (600 mg Oral Given 10/04/16 0816)  Initial Impression / Assessment and Plan / ED Course  I have reviewed the triage vital signs and the nursing notes.  Pertinent labs & imaging results that were available during my care of the patient were reviewed by me and considered in my medical decision making (see chart for details).     Theresa Galloway is a 51 y.o. female with a past medical history heavy for hypertension and hepatitis B who presents with 1 month of productive cough, chills, shortness of breath, and right central chest pain. Patient says that for the last month, she's had gradually worsening symptoms. She says that her chest discomfort is a aching and tightness in her right central chest. She says that coughing makes it much worse. It is also pleuritic. She denies history of DVT or PE. She currently smokes. She describes the pain as a 7 out of 10 in severity. She reports subjective fevers and chills intermittently. She says that she has had a thick yellow sputum with her cough. She denies any recent trauma injury to her chest or abdomen. She denies any other  symptoms.  History and exam are seen above. On exam, patient has slightly diminished breath sounds on the right compared to left. No significant wheezing. No significant rales. Chest was not significantly tender. Abdomen nontender. Pulses symmetric in her radial arteries. Exam otherwise unremarkable. Legs nonswollen and not edematous.   Based on patient's symptoms, initial concern is for a pneumonia with a productive cough, pleuritic pain, and chills, thus chest x-ray and labs were ordered. Patient will also have d-dimer given the severely pleuritic chest pain rule out PE. Patient will have EKG and troponin as well her pain is partial in the central chest.  Evette Cristal will be given some pain medicine to try and help her pain.   Diagnostic testing results are seen above. No evidence of acute changes in EKG. No STEMI. Chest x-ray shows no pneumonia.   Laboratory testing showed mild leukocytosis and slight hypokalemia.  Given reassuring workup, suspect a viral URI leading to cough causing the right-sided chest pain. Patient given prescription for Tessa lawn and a muscle relaxant to help her and patient given strict return precautions for any new or worsened symptoms as well as PCP follow-up instructions. Patient voiced understanding of the plan of care and had no other questions or concerns. Patient discharged in good condition with improvement in symptoms.    Final Clinical Impressions(s) / ED Diagnoses   Final diagnoses:  Cough  Chest pain, unspecified type    New Prescriptions Discharge Medication List as of 10/04/2016  9:58 AM    START taking these medications   Details  benzonatate (TESSALON) 100 MG capsule Take 1 capsule (100 mg total) by mouth every 8 (eight) hours., Starting Mon 10/04/2016, Print    cyclobenzaprine (FLEXERIL) 10 MG tablet Take 1 tablet (10 mg total) by mouth 2 (two) times daily as needed for muscle spasms., Starting Mon 10/04/2016, Print        Clinical Impression: 1.  Cough   2. Chest pain, unspecified type     Disposition: Discharge  Condition: Good  I have discussed the results, Dx and Tx plan with the pt(& family if present). He/she/they expressed understanding and agree(s) with the plan. Discharge instructions discussed at great length. Strict return precautions discussed and pt &/or family have verbalized understanding of the instructions. No further questions at time of discharge.    Discharge Medication List as of 10/04/2016  9:58 AM  START taking these medications   Details  benzonatate (TESSALON) 100 MG capsule Take 1 capsule (100 mg total) by mouth every 8 (eight) hours., Starting Mon 10/04/2016, Print    cyclobenzaprine (FLEXERIL) 10 MG tablet Take 1 tablet (10 mg total) by mouth 2 (two) times daily as needed for muscle spasms., Starting Mon 10/04/2016, Print        [facility] Up: [facility] [address] [place] [id number] [phone] Schedule an appointment as soon as possible for a visit    [facility] [address] [id number] [facility] [address] [place] [phone]  If symptoms worsen     Tegeler, Canary Brimhristopher J, MD 10/04/16 2144

## 2016-10-04 NOTE — ED Notes (Signed)
Patient transported to X-ray 

## 2017-03-07 ENCOUNTER — Encounter (HOSPITAL_BASED_OUTPATIENT_CLINIC_OR_DEPARTMENT_OTHER): Payer: Self-pay

## 2017-03-07 ENCOUNTER — Emergency Department (HOSPITAL_BASED_OUTPATIENT_CLINIC_OR_DEPARTMENT_OTHER)
Admission: EM | Admit: 2017-03-07 | Discharge: 2017-03-07 | Disposition: A | Discharge status: Home / Self Care | Payer: PRIVATE HEALTH INSURANCE | Attending: Emergency Medicine | Admitting: Emergency Medicine

## 2017-03-07 ENCOUNTER — Other Ambulatory Visit: Payer: Self-pay

## 2017-03-07 DIAGNOSIS — Z79899 Other long term (current) drug therapy: Secondary | ICD-10-CM | POA: Insufficient documentation

## 2017-03-07 DIAGNOSIS — I10 Essential (primary) hypertension: Secondary | ICD-10-CM | POA: Insufficient documentation

## 2017-03-07 DIAGNOSIS — L729 Follicular cyst of the skin and subcutaneous tissue, unspecified: Secondary | ICD-10-CM

## 2017-03-07 DIAGNOSIS — A599 Trichomoniasis, unspecified: Secondary | ICD-10-CM

## 2017-03-07 DIAGNOSIS — I160 Hypertensive urgency: Secondary | ICD-10-CM | POA: Insufficient documentation

## 2017-03-07 DIAGNOSIS — L089 Local infection of the skin and subcutaneous tissue, unspecified: Secondary | ICD-10-CM | POA: Insufficient documentation

## 2017-03-07 DIAGNOSIS — N72 Inflammatory disease of cervix uteri: Secondary | ICD-10-CM

## 2017-03-07 DIAGNOSIS — A5909 Other urogenital trichomoniasis: Secondary | ICD-10-CM | POA: Insufficient documentation

## 2017-03-07 DIAGNOSIS — F1290 Cannabis use, unspecified, uncomplicated: Secondary | ICD-10-CM | POA: Insufficient documentation

## 2017-03-07 DIAGNOSIS — Z7982 Long term (current) use of aspirin: Secondary | ICD-10-CM | POA: Insufficient documentation

## 2017-03-07 DIAGNOSIS — Z659 Problem related to unspecified psychosocial circumstances: Secondary | ICD-10-CM

## 2017-03-07 DIAGNOSIS — F17210 Nicotine dependence, cigarettes, uncomplicated: Secondary | ICD-10-CM | POA: Insufficient documentation

## 2017-03-07 DIAGNOSIS — Z609 Problem related to social environment, unspecified: Secondary | ICD-10-CM | POA: Insufficient documentation

## 2017-03-07 DIAGNOSIS — Z9049 Acquired absence of other specified parts of digestive tract: Secondary | ICD-10-CM | POA: Insufficient documentation

## 2017-03-07 LAB — WET PREP, GENITAL
SPERM: NONE SEEN
Yeast Wet Prep HPF POC: NONE SEEN

## 2017-03-07 MED ORDER — LISINOPRIL 10 MG PO TABS
20.0000 mg | ORAL_TABLET | Freq: Once | ORAL | Status: AC
  Administered 2017-03-07: 20 mg via ORAL
  Filled 2017-03-07: qty 2

## 2017-03-07 MED ORDER — METRONIDAZOLE 500 MG PO TABS: 500.0000 mg | ORAL_TABLET | Freq: Two times a day (BID) | ORAL | 0 refills | Status: AC

## 2017-03-07 MED ORDER — AMLODIPINE BESYLATE 10 MG PO TABS: 10.0000 mg | ORAL_TABLET | Freq: Every day | ORAL | 1 refills | Status: AC

## 2017-03-07 MED ORDER — AZITHROMYCIN 250 MG PO TABS
1000.0000 mg | ORAL_TABLET | Freq: Once | ORAL | Status: AC
  Administered 2017-03-07: 1000 mg via ORAL
  Filled 2017-03-07: qty 4

## 2017-03-07 MED ORDER — CEFTRIAXONE SODIUM 250 MG IJ SOLR
250.0000 mg | Freq: Once | INTRAMUSCULAR | Status: AC
  Administered 2017-03-07: 250 mg via INTRAMUSCULAR
  Filled 2017-03-07: qty 250

## 2017-03-07 MED ORDER — LISINOPRIL 40 MG PO TABS: 40.0000 mg | ORAL_TABLET | Freq: Every day | ORAL | 1 refills | Status: AC

## 2017-03-07 MED ORDER — AMLODIPINE BESYLATE 5 MG PO TABS
10.0000 mg | ORAL_TABLET | Freq: Once | ORAL | Status: AC
  Administered 2017-03-07: 10 mg via ORAL
  Filled 2017-03-07: qty 2

## 2017-03-07 MED ORDER — LIDOCAINE HCL 2 % IJ SOLN
5.0000 mL | Freq: Once | INTRAMUSCULAR | Status: AC
  Administered 2017-03-07: 100 mg via INTRADERMAL
  Filled 2017-03-07: qty 20

## 2017-03-07 MED ORDER — FLUCONAZOLE 150 MG PO TABS: 150.0000 mg | ORAL_TABLET | Freq: Once | ORAL | 0 refills | Status: AC

## 2017-03-07 MED ORDER — METOPROLOL TARTRATE 25 MG PO TABS: 25.0000 mg | ORAL_TABLET | Freq: Two times a day (BID) | ORAL | 1 refills | Status: AC

## 2017-03-07 MED FILL — metroNIDAZOLE 500 MG TABS: 500 | 7 days supply | Qty: 14 | Fill #0

## 2017-03-07 MED FILL — AMLODIPINE BESYLATE 10 MG T: 10 | 30 days supply | Qty: 30 | Fill #0

## 2017-03-07 MED FILL — FLUCONAZOLE 150 MG TABLET: 150 | 1 days supply | Qty: 1 | Fill #0

## 2017-03-07 NOTE — ED Notes (Signed)
Notified RN AND Consulting civil engineerCharge RN that Pt wanted to leave AMA "she was tired of waiting"

## 2017-03-07 NOTE — ED Provider Notes (Addendum)
[facility] EM[facility]ENCY [facility]MENT Provider Note   CSN: [id number] Arrival date & time: 03/07/17  1338     History   Chief Complaint Chief Complaint  Patient presents with  . Headache    HPI Theresa Galloway M Stickels is a 51 y.o. female.  HPI Patient has multiple complaints and concerns.  She reports she came here straight from work.  She reports she had a headache for over a week is generalized in nature.  Reports she is felt slightly nauseated with blurry vision.  Patient reports that she is currently under a lot of stress.  She reports that she lost her home when she had lost her job but now she has a job again but has to stay with her niece or her son.  She is moving back and forth between their homes and is very stressful for her.  Reports she has been compliant with her blood pressure medications although she cannot name them or her doses.  Patient did not bring her antihypertensive medications with her. She reports she has been checking her blood pressures over the weekend on a home device and they have been running approximately 102/60s although apparently before this weekend, she had not been measuring it for a very long time.  No lower extremity swelling or calf pain.  No chest pain no palpitations.  No syncope.  Patient also reports she has a boil on her chest.  She reports that had drained some foul smelling fluid when she had pushed on it earlier in the week.  Now it has healed over but is still very tender and itchy.  Patient reports he has been having foul-smelling discharge.  She reports she is also had vaginal itching.  She denies she has significant amount of pain.  She reports she is sexually active.  She is unsure if she has possible STD exposure.  Patient tried 3 days of Monistat with no improvement.  She is concerned for possible pelvic infection would like examination. Past Medical History:  Diagnosis Date  . Hepatitis B   . Hypertension     Patient Active Problem  List   Diagnosis Date Noted  . Non-intractable vomiting with nausea   . Substance abuse (HCC)   . Intractable nausea and vomiting 05/22/2016  . Chest pain 05/22/2016  . Abdominal pain 05/22/2016  . Sepsis (HCC) 05/22/2016  . Hypokalemia 05/22/2016  . Cough 05/22/2016  . Flu-like symptoms 05/22/2016  . Hypertension     Past Surgical History:  Procedure Laterality Date  . ABDOMINAL HYSTERECTOMY    . CHOLECYSTECTOMY      OB History    No data available       Home Medications    Prior to Admission medications   Medication Sig Start Date End Date Taking? Authorizing Provider  amLODipine (NORVASC) 10 MG tablet Take 1 tablet (10 mg total) by mouth daily. 05/25/16   Albertine GratesXu, Fang, MD  amLODipine (NORVASC) 10 MG tablet Take 1 tablet (10 mg total) by mouth daily. 03/07/17   Arby BarrettePfeiffer, Nicki Furlan, MD  aspirin EC 81 MG tablet Take 1 tablet (81 mg total) by mouth daily. 05/24/16   Albertine GratesXu, Fang, MD  fluconazole (DIFLUCAN) 150 MG tablet Take 1 tablet (150 mg total) by mouth once for 1 dose. 03/07/17 03/07/17  Arby BarrettePfeiffer, Kerie Badger, MD  lisinopril (PRINIVIL,ZESTRIL) 40 MG tablet Take 1 tablet (40 mg total) by mouth daily. 05/25/16   Albertine GratesXu, Fang, MD  lisinopril (PRINIVIL,ZESTRIL) 40 MG tablet Take 1 tablet (40 mg  total) by mouth daily. 03/07/17   Arby BarrettePfeiffer, Asriel Westrup, MD  metoprolol tartrate (LOPRESSOR) 25 MG tablet Take 25 mg by mouth 2 (two) times daily.    [provider]  metoprolol tartrate (LOPRESSOR) 25 MG tablet Take 1 tablet (25 mg total) by mouth 2 (two) times daily. 03/07/17   Arby BarrettePfeiffer, Tajae Rybicki, MD  metroNIDAZOLE (FLAGYL) 500 MG tablet Take 1 tablet (500 mg total) by mouth 2 (two) times daily. One po bid x 7 days 03/07/17   Arby BarrettePfeiffer, Isatou Agredano, MD    Family History Family History  Problem Relation Age of Onset  . Gastric cancer Mother   . Obesity Father   . Diabetes Mellitus II Brother   . Kidney disease Brother   . Congestive Heart Failure Brother     Social History Social History   Tobacco Use    . Smoking status: Current Every Day Smoker    Packs/day: 0.50    Types: Cigarettes  . Smokeless tobacco: Never Used  Substance Use Topics  . Alcohol use: Yes    Comment: occ  . Drug use: Yes    Types: Marijuana     Allergies   Patient has no known allergies.   Review of Systems Review of Systems 10 Systems reviewed and are negative for acute change except as noted in the HPI.   Physical Exam Updated Vital Signs BP (!) 220/134 (BP Location: Right Arm)   Pulse 68   Temp 99.2 F (37.3 C) (Oral)   Resp 18   Ht 5\' 3"  (1.6 m)   Wt 70.3 kg (155 lb)   SpO2 99%   BMI 27.46 kg/m   Physical Exam  Constitutional: She is oriented to person, place, and time. She appears well-developed and well-nourished. No distress.  HENT:  Head: Normocephalic and atraumatic.  Nose: Nose normal.  Mouth/Throat: Oropharynx is clear and moist.  Eyes: Conjunctivae and EOM are normal. Pupils are equal, round, and reactive to light.  Neck: Neck supple.  Cardiovascular: Normal rate, regular rhythm, normal heart sounds and intact distal pulses.  No murmur heard. Pulmonary/Chest: Effort normal and breath sounds normal. No respiratory distress.  Abdominal: Soft. She exhibits no distension. There is no tenderness. There is no guarding.  Genitourinary:  Genitourinary Comments: External vaginal tissue normal. Speculum exam moderate to copious yellow, frothy malodorous discharge in the vault. Bimanual exam nontender.  Musculoskeletal: Normal range of motion. She exhibits no edema or tenderness.  Neurological: She is alert and oriented to person, place, and time. No cranial nerve deficit. She exhibits normal muscle tone. Coordination normal.  Skin: Skin is warm and dry.  Patient has a 2.5 cm nodule on the anterior right chest wall, superior to the axilla and the soft tissues.  There is slight fluctuance in the overlying hyperpigmentation but not significant erythema.  No active drainage.  No surrounding  cellulitis.  Psychiatric: She has a normal mood and affect.  Nursing note and vitals reviewed.    ED Treatments / Results  Labs (all labs ordered are listed, but only abnormal results are displayed) Labs Reviewed  WET PREP, GENITAL - Abnormal; Notable for the following components:      Result Value   Trich, Wet Prep PRESENT (*)    Clue Cells Wet Prep HPF POC PRESENT (*)    WBC, Wet Prep HPF POC MANY (*)    All other components within normal limits  GC/CHLAMYDIA PROBE AMP (Elsie) NOT AT [facility]    EKG  EKG Interpretation None  Radiology No results found.  Procedures .Marland KitchenIncision and Drainage Date/Time: 03/07/2017 5:15 PM Performed by: Arby Barrette, MD Authorized by: Arby Barrette, MD   Consent:    Consent obtained:  Verbal   Consent given by:  Patient   Risks discussed:  Incomplete drainage, infection, pain and bleeding   Alternatives discussed:  No treatment and delayed treatment Location:    Type:  Abscess   Size:  2.5   Location:  Trunk   Trunk location:  Chest Pre-procedure details:    Skin preparation:  Betadine Anesthesia (see MAR for exact dosages):    Anesthesia method:  Local infiltration   Local anesthetic:  Lidocaine 2% w/o epi Procedure type:    Complexity:  Simple Procedure details:    Needle aspiration: no     Incision types:  Single straight   Incision depth:  Subcutaneous   Scalpel blade:  11   Drainage:  Purulent   Drainage amount:  Moderate   Wound treatment:  Wound left open   Packing materials:  None Post-procedure details:    Patient tolerance of procedure:  Tolerated well, no immediate complications   (including critical care time)  Medications Ordered in ED Medications  amLODipine (NORVASC) tablet 10 mg (10 mg Oral Given 03/07/17 1551)  lisinopril (PRINIVIL,ZESTRIL) tablet 20 mg (20 mg Oral Given 03/07/17 1551)  lidocaine (XYLOCAINE) 2 % (with pres) injection 100 mg (100 mg Intradermal Given 03/07/17 1551)    cefTRIAXone (ROCEPHIN) injection 250 mg (250 mg Intramuscular Given 03/07/17 1714)  azithromycin (ZITHROMAX) tablet 1,000 mg (1,000 mg Oral Given 03/07/17 1718)     Initial Impression / Assessment and Plan / ED Course  I have reviewed the triage vital signs and the nursing notes.  Pertinent labs & imaging results that were available during my care of the patient were reviewed by me and considered in my medical decision making (see chart for details).      Final Clinical Impressions(s) / ED Diagnoses   Final diagnoses:  Hypertensive urgency  Infected cyst of skin  Other social stressor  Cervicitis  Trichimoniasis   Problem #1.  Hypertension.  Patient reports compliance with her medications however she does not recall the doses that she is taking.  She reports she is not taking amlodipine which was last on her med rec.  Patient denies that she is using any cocaine.  She reports she has not done that in months.  Reports to be fine to drug screen her if neded.  Appropriate.  She shows no distress or confusion.  No chest pain.  Will be given prescriptions for medications that were last prescribed in the doses last prescribed.  If she is taking medications as she recalls, amlodipine will be an additional medication for her.  Problem #2 patient reports that abscess on her chest wall.  This is fairly limited at 2.5 cm without surrounding cellulitis it is been drained with moderate amount of purulent output.  No empiric antibiotics needed.  #3 vaginal discharge.  Patient does have a malodorous and frothy discharge in the vault.  Patient is sexually active.  She is uncertain of possible STD exposure.  Patient will be treated empirically with Rocephin and Zithromax in the emergency department.  Flagyl was provided for trichomonas bacterial vaginosis that is symptomatic.  Follow-up on maintenance of blood pressure medications and other medical concerns. ED Discharge Orders        Ordered     amLODipine (NORVASC) 10 MG tablet  Daily  03/07/17 1711    lisinopril (PRINIVIL,ZESTRIL) 40 MG tablet  Daily     03/07/17 1711    metoprolol tartrate (LOPRESSOR) 25 MG tablet  2 times daily     03/07/17 1711    fluconazole (DIFLUCAN) 150 MG tablet   Once     03/07/17 1711    metroNIDAZOLE (FLAGYL) 500 MG tablet  2 times daily     03/07/17 1711       Arby Barrette, MD 03/07/17 1722    Arby Barrette, MD 03/07/17 1727

## 2017-03-07 NOTE — ED Notes (Signed)
ED Provider at bedside. 

## 2017-03-07 NOTE — ED Triage Notes (Signed)
C/o HA, dizziness x 1 week-NAD-steady gait

## 2017-03-07 NOTE — ED Notes (Signed)
Pt upset re: wait time. EDP stepped in room to update pt and told pt she would be in to I&D in approx 15 minutes. Pt sts she will wait for now.

## 2017-03-08 LAB — GC/CHLAMYDIA PROBE AMP (~~LOC~~) NOT AT ARMC
CHLAMYDIA, DNA PROBE: NEGATIVE
NEISSERIA GONORRHEA: NEGATIVE

## 2018-04-02 ENCOUNTER — Other Ambulatory Visit: Payer: Self-pay

## 2018-04-02 ENCOUNTER — Encounter (HOSPITAL_BASED_OUTPATIENT_CLINIC_OR_DEPARTMENT_OTHER): Payer: Self-pay | Admitting: Emergency Medicine

## 2018-04-02 ENCOUNTER — Emergency Department (HOSPITAL_BASED_OUTPATIENT_CLINIC_OR_DEPARTMENT_OTHER)
Admission: EM | Admit: 2018-04-02 | Discharge: 2018-04-02 | Disposition: A | Discharge status: Home / Self Care | Payer: Commercial Managed Care - PPO | Attending: Emergency Medicine | Admitting: Emergency Medicine

## 2018-04-02 DIAGNOSIS — I10 Essential (primary) hypertension: Secondary | ICD-10-CM | POA: Diagnosis not present

## 2018-04-02 DIAGNOSIS — H6692 Otitis media, unspecified, left ear: Secondary | ICD-10-CM | POA: Diagnosis not present

## 2018-04-02 DIAGNOSIS — Z9049 Acquired absence of other specified parts of digestive tract: Secondary | ICD-10-CM | POA: Insufficient documentation

## 2018-04-02 DIAGNOSIS — H9202 Otalgia, left ear: Secondary | ICD-10-CM | POA: Diagnosis present

## 2018-04-02 DIAGNOSIS — Z79899 Other long term (current) drug therapy: Secondary | ICD-10-CM | POA: Diagnosis not present

## 2018-04-02 DIAGNOSIS — Z7982 Long term (current) use of aspirin: Secondary | ICD-10-CM | POA: Diagnosis not present

## 2018-04-02 DIAGNOSIS — F17210 Nicotine dependence, cigarettes, uncomplicated: Secondary | ICD-10-CM | POA: Diagnosis not present

## 2018-04-02 DIAGNOSIS — H6690 Otitis media, unspecified, unspecified ear: Secondary | ICD-10-CM

## 2018-04-02 LAB — GROUP A STREP BY PCR: GROUP A STREP BY PCR: NOT DETECTED

## 2018-04-02 MED ORDER — AMOXICILLIN 500 MG PO TABS: 1000.0000 mg | ORAL_TABLET | Freq: Two times a day (BID) | ORAL | 0 refills | Status: AC

## 2018-04-02 MED ORDER — IBUPROFEN 600 MG PO TABS: 600.0000 mg | ORAL_TABLET | Freq: Three times a day (TID) | ORAL | 0 refills | Status: AC | PRN

## 2018-04-02 NOTE — Discharge Instructions (Signed)
1.  Schedule recheck with your family doctor in 7 to 10 days.

## 2018-04-02 NOTE — ED Triage Notes (Signed)
L ear pain and sore throat since yesterday.  

## 2018-04-02 NOTE — ED Notes (Signed)
ED Provider at bedside. 

## 2018-04-02 NOTE — ED Provider Notes (Signed)
[facility] EMERGENCY DEPARTMENT Provider Note   CSN: [id number] Arrival date & time: 04/02/18  0741     History   Chief Complaint Chief Complaint  Patient presents with  . Sore Throat  . Otalgia    HPI Theresa Galloway is a 52 y.o. female.  HPI Patient has a left earache and a sore throat that started yesterday.  She reports the ear has a very sharp pain.  It hurts when she swallows.  No documented fever.  No cough or shortness of breath.  Difficulty breathing.  No sick contacts that she knows of. Past Medical History:  Diagnosis Date  . Hepatitis B   . Hypertension     Patient Active Problem List   Diagnosis Date Noted  . Non-intractable vomiting with nausea   . Substance abuse (HCC)   . Intractable nausea and vomiting 05/22/2016  . Chest pain 05/22/2016  . Abdominal pain 05/22/2016  . Sepsis (HCC) 05/22/2016  . Hypokalemia 05/22/2016  . Cough 05/22/2016  . Flu-like symptoms 05/22/2016  . Hypertension     Past Surgical History:  Procedure Laterality Date  . ABDOMINAL HYSTERECTOMY    . CHOLECYSTECTOMY       OB History   No obstetric history on file.      Home Medications    Prior to Admission medications   Medication Sig Start Date End Date Taking? Authorizing Provider  amLODipine (NORVASC) 10 MG tablet Take 1 tablet (10 mg total) by mouth daily. 03/07/17  Yes Arby BarrettePfeiffer, Liona Wengert, MD  lisinopril (PRINIVIL,ZESTRIL) 40 MG tablet Take 1 tablet (40 mg total) by mouth daily. 05/25/16  Yes Albertine GratesXu, Fang, MD  amLODipine (NORVASC) 10 MG tablet Take 1 tablet (10 mg total) by mouth daily. 05/25/16   Albertine GratesXu, Fang, MD  amoxicillin (AMOXIL) 500 MG tablet Take 2 tablets (1,000 mg total) by mouth 2 (two) times daily. 04/02/18   Arby BarrettePfeiffer, Dhara Schepp, MD  aspirin EC 81 MG tablet Take 1 tablet (81 mg total) by mouth daily. 05/24/16   Albertine GratesXu, Fang, MD  ibuprofen (ADVIL,MOTRIN) 600 MG tablet Take 1 tablet (600 mg total) by mouth every 8 (eight) hours as needed. 04/02/18   Arby BarrettePfeiffer, Kylle Lall,  MD  lisinopril (PRINIVIL,ZESTRIL) 40 MG tablet Take 1 tablet (40 mg total) by mouth daily. 03/07/17   Arby BarrettePfeiffer, Kule Gascoigne, MD  metoprolol tartrate (LOPRESSOR) 25 MG tablet Take 25 mg by mouth 2 (two) times daily.    [provider]  metoprolol tartrate (LOPRESSOR) 25 MG tablet Take 1 tablet (25 mg total) by mouth 2 (two) times daily. 03/07/17   Arby BarrettePfeiffer, Chynna Buerkle, MD  metroNIDAZOLE (FLAGYL) 500 MG tablet Take 1 tablet (500 mg total) by mouth 2 (two) times daily. One po bid x 7 days 03/07/17   Arby BarrettePfeiffer, Mykah Bellomo, MD    Family History Family History  Problem Relation Age of Onset  . Gastric cancer Mother   . Obesity Father   . Diabetes Mellitus II Brother   . Kidney disease Brother   . Congestive Heart Failure Brother     Social History Social History   Tobacco Use  . Smoking status: Current Every Day Smoker    Packs/day: 0.50    Types: Cigarettes  . Smokeless tobacco: Never Used  Substance Use Topics  . Alcohol use: Yes    Comment: occ  . Drug use: Yes    Types: Marijuana     Allergies   Patient has no known allergies.   Review of Systems Review of Systems Constitutional: No  fever no myalgia Respiratory: No cough no shortness of breath no chest pain GI: No vomiting or diarrhea  Physical Exam Updated Vital Signs BP (!) 181/118   Pulse 70   Temp 98.2 F (36.8 C) (Oral)   Resp 20   Ht 5\' 3"  (1.6 m)   Wt 68 kg   SpO2 98%   BMI 26.57 kg/m   Physical Exam Constitutional:      Comments: Patient is alert and nontoxic.  Clinically well in appearance.  HENT:     Head: Normocephalic and atraumatic.     Ears:     Comments: Left TM slightly dull and retracted.  No erythema no bulging.  Right TM normal.    Nose: Nose normal.     Mouth/Throat:     Comments: Throat is widely patent.  No erythema or exudates of tonsils.  Mucous membranes are moist. Uvula midline. Eyes:     Extraocular Movements: Extraocular movements intact.  Neck:     Musculoskeletal: Neck supple.    Cardiovascular:     Rate and Rhythm: Normal rate and regular rhythm.  Pulmonary:     Effort: Pulmonary effort is normal.  Skin:    General: Skin is warm and dry.  Neurological:     General: No focal deficit present.     Mental Status: She is oriented to person, place, and time.     Coordination: Coordination normal.      ED Treatments / Results  Labs (all labs ordered are listed, but only abnormal results are displayed) Labs Reviewed  GROUP A STREP BY PCR    EKG None  Radiology No results found.  Procedures Procedures (including critical care time)  Medications Ordered in ED Medications - No data to display   Initial Impression / Assessment and Plan / ED Course  I have reviewed the triage vital signs and the nursing notes.  Pertinent labs & imaging results that were available during my care of the patient were reviewed by me and considered in my medical decision making (see chart for details).     Patient is clinically well in appearance.  She has had onset acutely of left ear pain and sore throat.  Rapid strep negative.  Ear shows some retraction and dullness.  Will initiate treatment for otitis media.  Patient otherwise nontoxic and well.  Final Clinical Impressions(s) / ED Diagnoses   Final diagnoses:  Acute otitis media, unspecified otitis media type    ED Discharge Orders         Ordered    amoxicillin (AMOXIL) 500 MG tablet  2 times daily     04/02/18 0910    ibuprofen (ADVIL,MOTRIN) 600 MG tablet  Every 8 hours PRN     04/02/18 0910           Arby BarrettePfeiffer, Kaidan Harpster, MD 04/02/18 [phone]

## 2018-09-28 IMAGING — CT CT HEAD W/O CM
3 series · 16 of 47 positions shown, 19 images · non-contrast
Comparison: 10/03/2014

CLINICAL DATA: Headache, nausea

EXAM:
CT HEAD WITHOUT CONTRAST
TECHNIQUE: Contiguous axial images were obtained from the base of the skull
through the vertex without intravenous contrast.

[Series 2: head 5.0 h30s · axial · 0.41mm/px · z∈[+1398,+1523]mm · 10 of 31 slices shown, 13 images]
[im 3/31  brain]
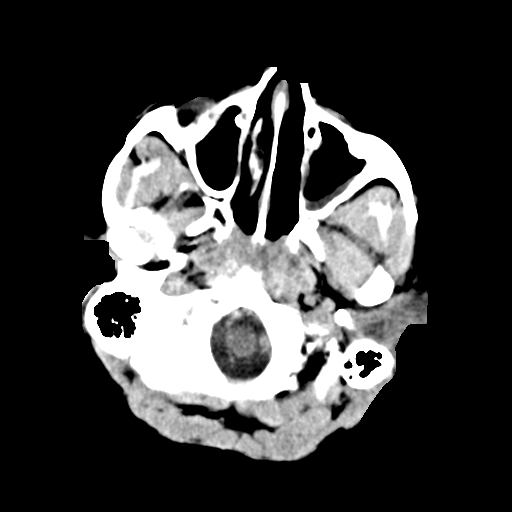
[im 3/31  bone]
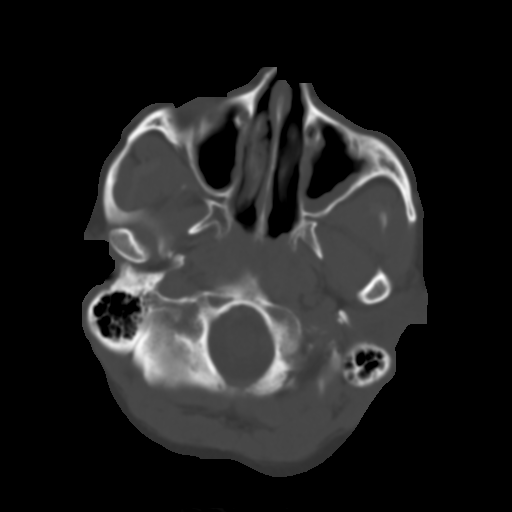
[im 6/31  brain]
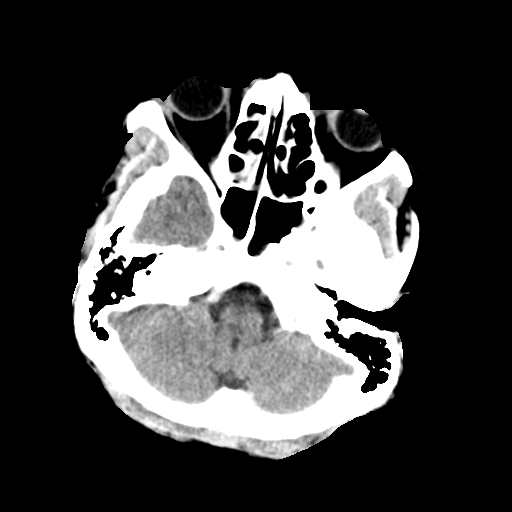
[im 9/31  brain]
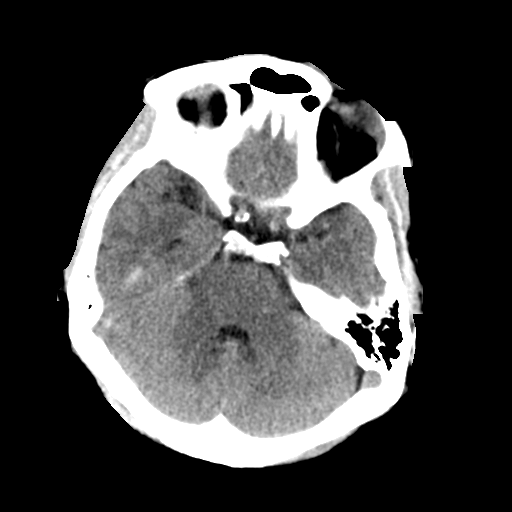
[im 11/31  brain]
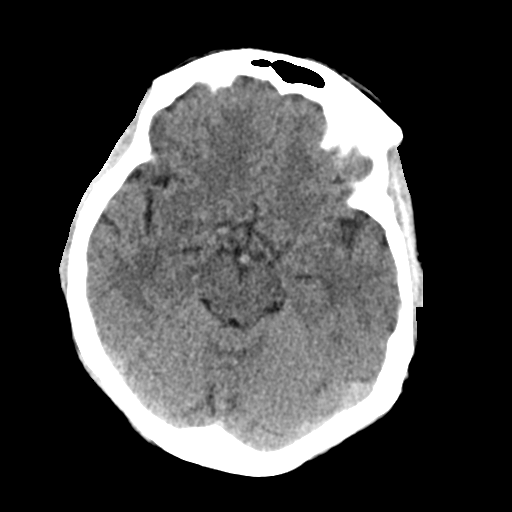
[im 14/31  brain]
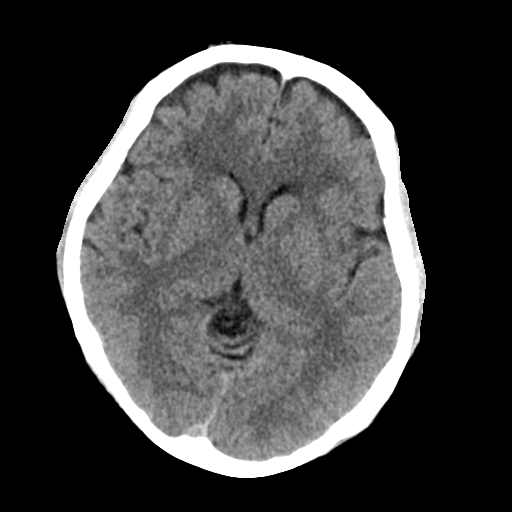
[im 14/31  bone]
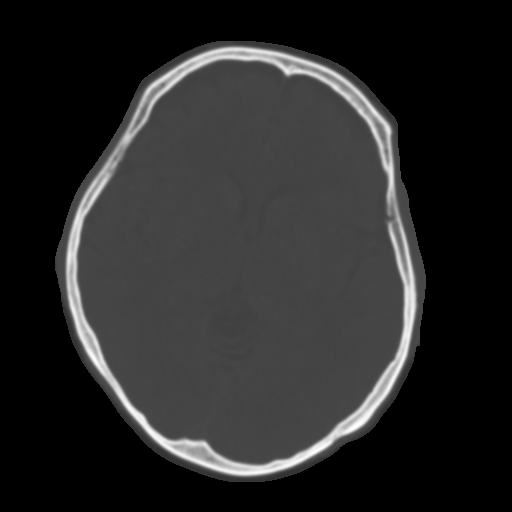
[im 17/31  brain]
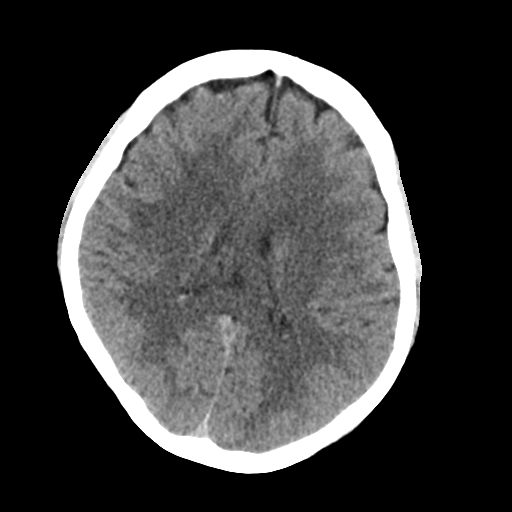
[im 20/31  brain]
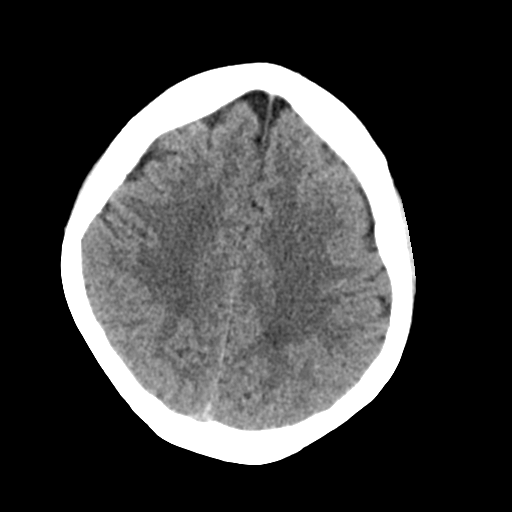
[im 23/31  brain]
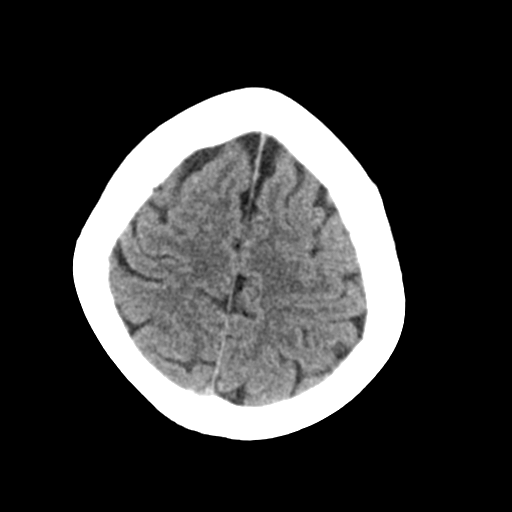
[im 25/31  brain]
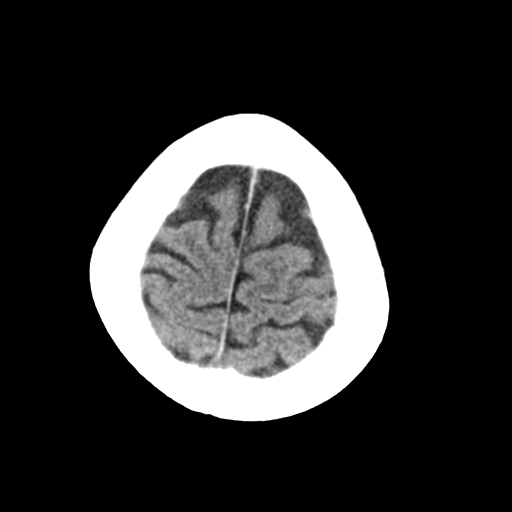
[im 25/31  bone]
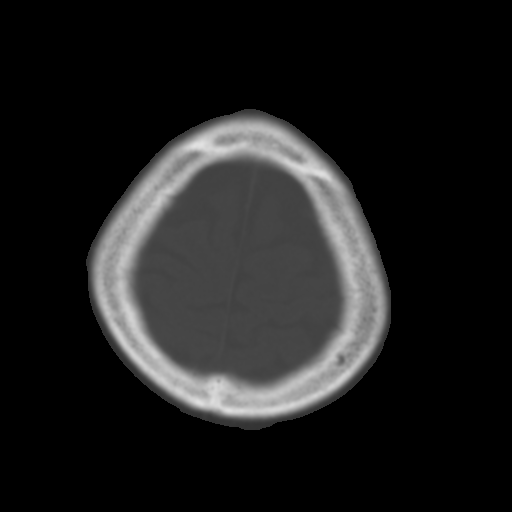
[im 28/31  brain]
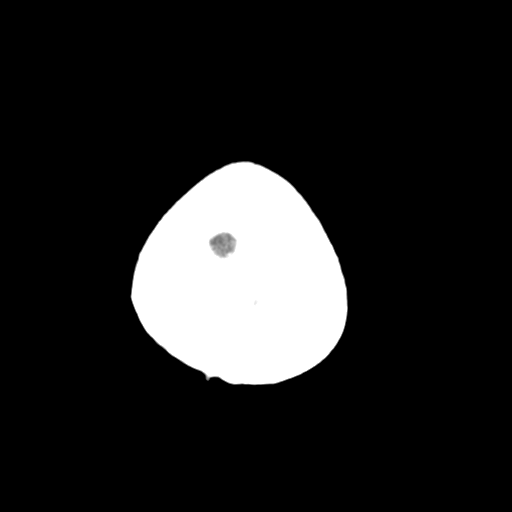

[Series 4: head 3.0 mpr cor · coronal · 0.30mm/px · 3 of 67 slices shown]
[im 23/67  brain]
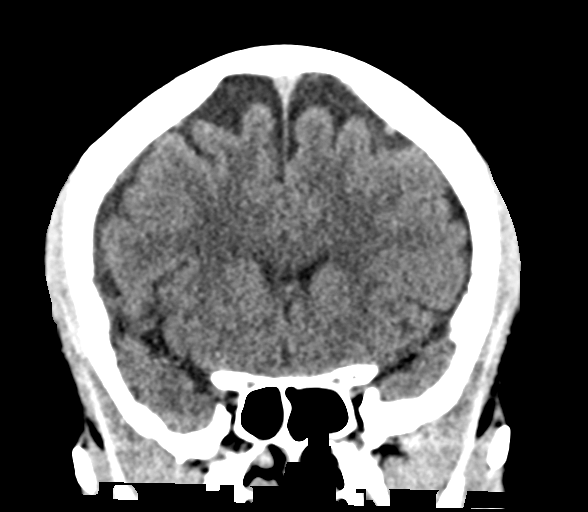
[im 30/67  brain]
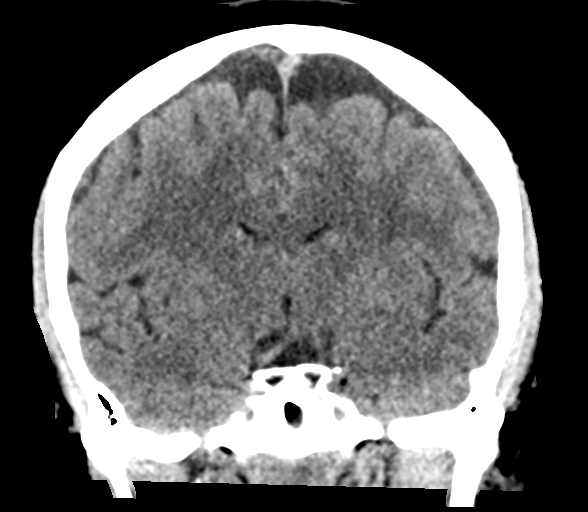
[im 37/67  brain]
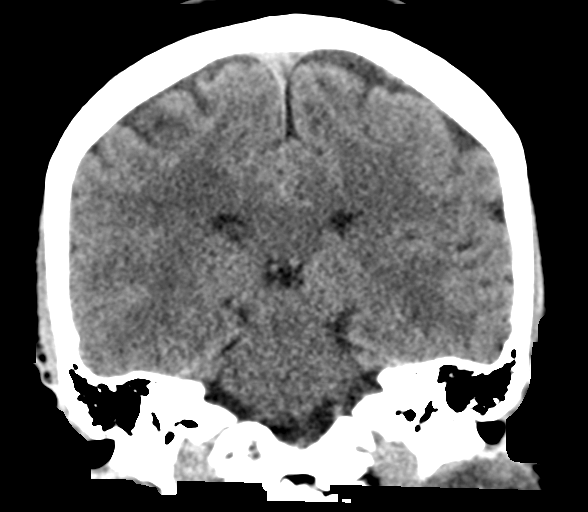

[Series 5: head 3.0 mpr sag · sagittal · 0.30mm/px · 3 of 54 slices shown]
[im 18/54  brain]
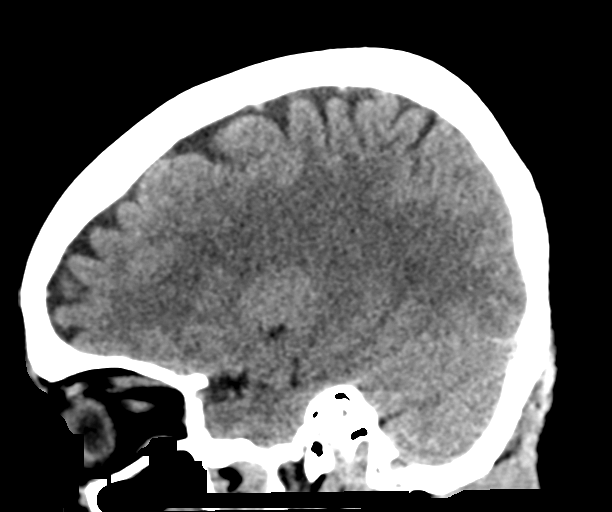
[im 27/54  brain]
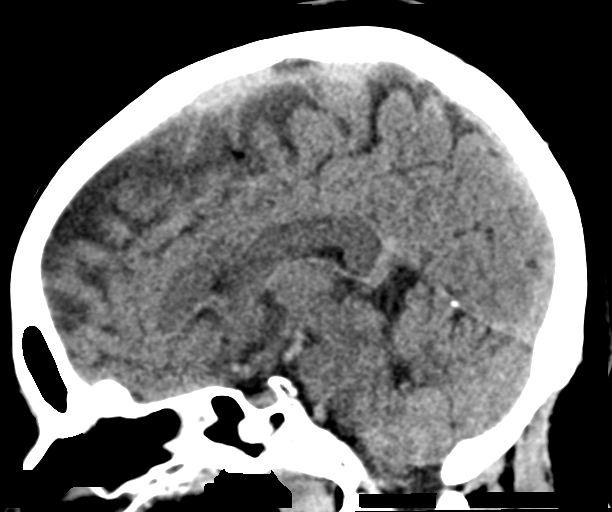
[im 36/54  brain]
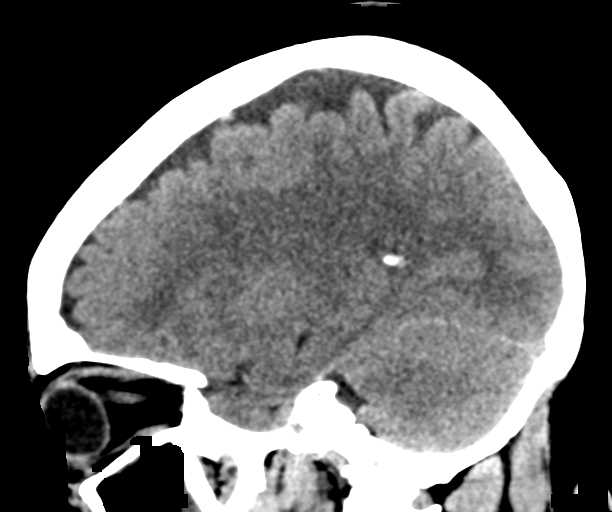

[16 of 47 positions shown; findings below may reference images not displayed]

FINDINGS: Brain: No evidence of acute infarction, hemorrhage, hydrocephalus,
extra-axial collection or mass lesion/mass effect.

Vascular: No hyperdense vessel or unexpected calcification.

Skull: Normal. Negative for fracture or focal lesion.

Sinuses/Orbits: Mucosal thickening/ partial opacification of the
bilateral ethmoid, sphenoid, and maxillary sinuses. Mastoid air
cells are clear.

Other: None.
IMPRESSION: No evidence of acute intracranial abnormality.

Partial opacification of the bilateral paranasal sinuses, as above.

## 2019-10-15 ENCOUNTER — Encounter (HOSPITAL_BASED_OUTPATIENT_CLINIC_OR_DEPARTMENT_OTHER): Payer: Self-pay | Admitting: *Deleted

## 2019-10-15 ENCOUNTER — Other Ambulatory Visit: Payer: Self-pay

## 2019-10-15 ENCOUNTER — Emergency Department (HOSPITAL_BASED_OUTPATIENT_CLINIC_OR_DEPARTMENT_OTHER)
Admission: EM | Admit: 2019-10-15 | Discharge: 2019-10-15 | Discharge status: Against Medical Advice | Payer: Commercial Managed Care - PPO | Attending: Emergency Medicine | Admitting: Emergency Medicine

## 2019-10-15 DIAGNOSIS — Z7982 Long term (current) use of aspirin: Secondary | ICD-10-CM | POA: Insufficient documentation

## 2019-10-15 DIAGNOSIS — R1084 Generalized abdominal pain: Secondary | ICD-10-CM | POA: Insufficient documentation

## 2019-10-15 DIAGNOSIS — R030 Elevated blood-pressure reading, without diagnosis of hypertension: Secondary | ICD-10-CM

## 2019-10-15 DIAGNOSIS — Z79899 Other long term (current) drug therapy: Secondary | ICD-10-CM | POA: Insufficient documentation

## 2019-10-15 DIAGNOSIS — R112 Nausea with vomiting, unspecified: Secondary | ICD-10-CM | POA: Insufficient documentation

## 2019-10-15 DIAGNOSIS — F17210 Nicotine dependence, cigarettes, uncomplicated: Secondary | ICD-10-CM | POA: Insufficient documentation

## 2019-10-15 DIAGNOSIS — I10 Essential (primary) hypertension: Secondary | ICD-10-CM | POA: Insufficient documentation

## 2019-10-15 LAB — CBC
HCT: 48.6 % — ABNORMAL HIGH (ref 36.0–46.0)
Hemoglobin: 15.8 g/dL — ABNORMAL HIGH (ref 12.0–15.0)
MCH: 26.7 pg (ref 26.0–34.0)
MCHC: 32.5 g/dL (ref 30.0–36.0)
MCV: 82.2 fL (ref 80.0–100.0)
Platelets: 330 10*3/uL (ref 150–400)
RBC: 5.91 MIL/uL — ABNORMAL HIGH (ref 3.87–5.11)
RDW: 14.6 % (ref 11.5–15.5)
WBC: 20.2 10*3/uL — ABNORMAL HIGH (ref 4.0–10.5)
nRBC: 0 % (ref 0.0–0.2)

## 2019-10-15 MED ORDER — SODIUM CHLORIDE 0.9 % IV BOLUS: 1000.0000 mL | Freq: Once | INTRAVENOUS | Status: DC

## 2019-10-15 MED ORDER — ONDANSETRON HCL 4 MG/2ML IJ SOLN
4.0000 mg | Freq: Once | INTRAMUSCULAR | Status: AC | PRN
  Administered 2019-10-15: 4 mg via INTRAVENOUS
  Filled 2019-10-15: qty 2

## 2019-10-15 NOTE — ED Triage Notes (Signed)
C/o diffuse abd pain n/v   X 2 days , Mariajuana abuse

## 2019-10-15 NOTE — ED Notes (Addendum)
Pt refused further labs, fluid and refused vitals. Pt said "I don't want to stay here half the night." EDP Belfi informed.

## 2019-10-15 NOTE — ED Provider Notes (Signed)
[facility] EMERGENCY DEPARTMENT Provider Note   CSN: [id number] Arrival date & time: 10/15/19  1816     History Chief Complaint  Patient presents with  . Abdominal Pain    Theresa Galloway is a 54 y.o. female.  Patient is a 54 year old female who presents with abdominal pain.  She reports 2 to 3-day history of pain across her upper abdomen that radiates to her back.  She has had nausea and vomiting throughout today.  She has had some loose stools.  No fevers.  No blood in her emesis.  Her blood pressure was noted to be markedly elevated.  She says she has had some similar symptoms in the past but not this bad and it does not usually last this long.  She does smoke marijuana daily and was told in the past that may be related to marijuana use.  She has had her gallbladder out and says she has been having recurrent episodes of abdominal pain since she had her gallbladder out 4 years ago.  She currently says she is hungry and wants something to eat.        Past Medical History:  Diagnosis Date  . Hepatitis B   . Hypertension     Patient Active Problem List   Diagnosis Date Noted  . Non-intractable vomiting with nausea   . Substance abuse (HCC)   . Intractable nausea and vomiting 05/22/2016  . Chest pain 05/22/2016  . Abdominal pain 05/22/2016  . Sepsis (HCC) 05/22/2016  . Hypokalemia 05/22/2016  . Cough 05/22/2016  . Flu-like symptoms 05/22/2016  . Hypertension     Past Surgical History:  Procedure Laterality Date  . ABDOMINAL HYSTERECTOMY    . CHOLECYSTECTOMY       OB History   No obstetric history on file.     Family History  Problem Relation Age of Onset  . Gastric cancer Mother   . Obesity Father   . Diabetes Mellitus II Brother   . Kidney disease Brother   . Congestive Heart Failure Brother     Social History   Tobacco Use  . Smoking status: Current Every Day Smoker    Packs/day: 0.50    Types: Cigarettes  . Smokeless tobacco: Never Used    Substance Use Topics  . Alcohol use: Yes    Comment: occ  . Drug use: Yes    Types: Marijuana    Home Medications Prior to Admission medications   Medication Sig Start Date End Date Taking? Authorizing Provider  amLODipine (NORVASC) 10 MG tablet Take 1 tablet (10 mg total) by mouth daily. 05/25/16   Albertine Grates, MD  amLODipine (NORVASC) 10 MG tablet Take 1 tablet (10 mg total) by mouth daily. 03/07/17   Arby Barrette, MD  amoxicillin (AMOXIL) 500 MG tablet Take 2 tablets (1,000 mg total) by mouth 2 (two) times daily. 04/02/18   Arby Barrette, MD  aspirin EC 81 MG tablet Take 1 tablet (81 mg total) by mouth daily. 05/24/16   Albertine Grates, MD  ibuprofen (ADVIL,MOTRIN) 600 MG tablet Take 1 tablet (600 mg total) by mouth every 8 (eight) hours as needed. 04/02/18   Arby Barrette, MD  lisinopril (PRINIVIL,ZESTRIL) 40 MG tablet Take 1 tablet (40 mg total) by mouth daily. 05/25/16   Albertine Grates, MD  lisinopril (PRINIVIL,ZESTRIL) 40 MG tablet Take 1 tablet (40 mg total) by mouth daily. 03/07/17   Arby Barrette, MD  metoprolol tartrate (LOPRESSOR) 25 MG tablet Take 25 mg by mouth 2 (  two) times daily.    [provider]  metoprolol tartrate (LOPRESSOR) 25 MG tablet Take 1 tablet (25 mg total) by mouth 2 (two) times daily. 03/07/17   Arby Barrette, MD  metroNIDAZOLE (FLAGYL) 500 MG tablet Take 1 tablet (500 mg total) by mouth 2 (two) times daily. One po bid x 7 days 03/07/17   Arby Barrette, MD    Allergies    Patient has no known allergies.  Review of Systems   Review of Systems  Constitutional: Negative for chills, diaphoresis, fatigue and fever.  HENT: Negative for congestion, rhinorrhea and sneezing.   Eyes: Negative.   Respiratory: Negative for cough, chest tightness and shortness of breath.   Cardiovascular: Negative for chest pain and leg swelling.  Gastrointestinal: Positive for abdominal pain, nausea and vomiting. Negative for blood in stool and diarrhea.  Genitourinary:  Negative for difficulty urinating, flank pain, frequency and hematuria.  Musculoskeletal: Negative for arthralgias and back pain.  Skin: Negative for rash.  Neurological: Negative for dizziness, speech difficulty, weakness, numbness and headaches.    Physical Exam Updated Vital Signs BP (!) 207/113   Pulse 82   Temp 98.7 F (37.1 C)   Resp 18   SpO2 98%   Physical Exam Constitutional:      Appearance: She is well-developed.  HENT:     Head: Normocephalic and atraumatic.  Eyes:     Pupils: Pupils are equal, round, and reactive to light.  Cardiovascular:     Rate and Rhythm: Normal rate and regular rhythm.     Heart sounds: Normal heart sounds.  Pulmonary:     Effort: Pulmonary effort is normal. No respiratory distress.     Breath sounds: Normal breath sounds. No wheezing or rales.  Chest:     Chest wall: No tenderness.  Abdominal:     General: Bowel sounds are normal.     Palpations: Abdomen is soft.     Tenderness: There is abdominal tenderness in the right upper quadrant, epigastric area, periumbilical area and left upper quadrant. There is no guarding or rebound.  Musculoskeletal:        General: Normal range of motion.     Cervical back: Normal range of motion and neck supple.  Lymphadenopathy:     Cervical: No cervical adenopathy.  Skin:    General: Skin is warm and dry.     Findings: No rash.  Neurological:     Mental Status: She is alert and oriented to person, place, and time.     ED Results / Procedures / Treatments   Labs (all labs ordered are listed, but only abnormal results are displayed) Labs Reviewed  CBC - Abnormal; Notable for the following components:      Result Value   WBC 20.2 (*)    RBC 5.91 (*)    Hemoglobin 15.8 (*)    HCT 48.6 (*)    All other components within normal limits  URINALYSIS, ROUTINE W REFLEX MICROSCOPIC  COMPREHENSIVE METABOLIC PANEL  LIPASE, BLOOD    EKG None  Radiology No results found.  Procedures Procedures  (including critical care time)  Medications Ordered in ED Medications  sodium chloride 0.9 % bolus 1,000 mL (has no administration in time range)  ondansetron (ZOFRAN) injection 4 mg (4 mg Intravenous Given 10/15/19 1838)    ED Course  I have reviewed the triage vital signs and the nursing notes.  Pertinent labs & imaging results that were available during my care of the patient were reviewed  by me and considered in my medical decision making (see chart for details).    MDM Rules/Calculators/A&P                          Patient presents with abdominal pain across upper aspect of her abdomen.  This is associated with nausea and vomiting.  Her white count is elevated at 20,000.  I did do a chart review and it looks like she has had some similar symptoms with elevated WBC count in the past and has had negative CT scans.  However given this and her ongoing vomiting, I felt further evaluation needs to be done.  Her chemistry and lipase need to be redrawn.  At this point, patient is refusing any further evaluation and is leaving AMA.  She says she does not want any further evaluation or testing.   Final Clinical Impression(s) / ED Diagnoses Final diagnoses:  Generalized abdominal pain  Elevated blood pressure reading    Rx / DC Orders ED Discharge Orders    None       Rolan Bucco, MD 10/15/19 2001

## 2019-10-15 NOTE — ED Notes (Addendum)
PT refused Urine Specimen to this tech. PT stated she is about to leave.

## 2020-01-09 ENCOUNTER — Encounter (HOSPITAL_BASED_OUTPATIENT_CLINIC_OR_DEPARTMENT_OTHER): Payer: Self-pay | Admitting: Emergency Medicine

## 2020-01-09 ENCOUNTER — Emergency Department (HOSPITAL_BASED_OUTPATIENT_CLINIC_OR_DEPARTMENT_OTHER)
Admission: EM | Admit: 2020-01-09 | Discharge: 2020-01-09 | Disposition: A | Discharge status: Home / Self Care | Payer: Self-pay | Attending: Emergency Medicine | Admitting: Emergency Medicine

## 2020-01-09 ENCOUNTER — Emergency Department (HOSPITAL_BASED_OUTPATIENT_CLINIC_OR_DEPARTMENT_OTHER): Payer: Self-pay

## 2020-01-09 ENCOUNTER — Other Ambulatory Visit: Payer: Self-pay

## 2020-01-09 DIAGNOSIS — Z79899 Other long term (current) drug therapy: Secondary | ICD-10-CM | POA: Insufficient documentation

## 2020-01-09 DIAGNOSIS — F17210 Nicotine dependence, cigarettes, uncomplicated: Secondary | ICD-10-CM | POA: Insufficient documentation

## 2020-01-09 DIAGNOSIS — Z7982 Long term (current) use of aspirin: Secondary | ICD-10-CM | POA: Insufficient documentation

## 2020-01-09 DIAGNOSIS — I10 Essential (primary) hypertension: Secondary | ICD-10-CM | POA: Insufficient documentation

## 2020-01-09 DIAGNOSIS — Z20822 Contact with and (suspected) exposure to covid-19: Secondary | ICD-10-CM | POA: Insufficient documentation

## 2020-01-09 DIAGNOSIS — B349 Viral infection, unspecified: Secondary | ICD-10-CM | POA: Insufficient documentation

## 2020-01-09 DIAGNOSIS — D72829 Elevated white blood cell count, unspecified: Secondary | ICD-10-CM | POA: Insufficient documentation

## 2020-01-09 LAB — CBC WITH DIFFERENTIAL/PLATELET
Abs Immature Granulocytes: 0.07 10*3/uL (ref 0.00–0.07)
Basophils Absolute: 0.1 10*3/uL (ref 0.0–0.1)
Basophils Relative: 1 %
Eosinophils Absolute: 0.3 10*3/uL (ref 0.0–0.5)
Eosinophils Relative: 2 %
HCT: 44.8 % (ref 36.0–46.0)
Hemoglobin: 14.7 g/dL (ref 12.0–15.0)
Immature Granulocytes: 0 %
Lymphocytes Relative: 13 %
Lymphs Abs: 2.3 10*3/uL (ref 0.7–4.0)
MCH: 27.3 pg (ref 26.0–34.0)
MCHC: 32.8 g/dL (ref 30.0–36.0)
MCV: 83.1 fL (ref 80.0–100.0)
Monocytes Absolute: 1 10*3/uL (ref 0.1–1.0)
Monocytes Relative: 5 %
Neutro Abs: 14.1 10*3/uL — ABNORMAL HIGH (ref 1.7–7.7)
Neutrophils Relative %: 79 %
Platelets: 328 10*3/uL (ref 150–400)
RBC: 5.39 MIL/uL — ABNORMAL HIGH (ref 3.87–5.11)
RDW: 14.5 % (ref 11.5–15.5)
WBC: 17.8 10*3/uL — ABNORMAL HIGH (ref 4.0–10.5)
nRBC: 0 % (ref 0.0–0.2)

## 2020-01-09 LAB — RESPIRATORY PANEL BY RT PCR (FLU A&B, COVID)
Influenza A by PCR: NEGATIVE
Influenza B by PCR: NEGATIVE
SARS Coronavirus 2 by RT PCR: NEGATIVE

## 2020-01-09 LAB — BASIC METABOLIC PANEL
Anion gap: 10 (ref 5–15)
BUN: 16 mg/dL (ref 6–20)
CO2: 22 mmol/L (ref 22–32)
Calcium: 9.3 mg/dL (ref 8.9–10.3)
Chloride: 105 mmol/L (ref 98–111)
Creatinine, Ser: 0.75 mg/dL (ref 0.44–1.00)
GFR calc Af Amer: >60 mL/min (ref >60)
GFR calc non Af Amer: >60 mL/min (ref >60)
Glucose, Bld: 116 mg/dL — ABNORMAL HIGH (ref 70–99)
Potassium: 3.6 mmol/L (ref 3.5–5.1)
Sodium: 137 mmol/L (ref 135–145)

## 2020-01-09 MED ORDER — KETOROLAC TROMETHAMINE 15 MG/ML IJ SOLN
15.0000 mg | Freq: Once | INTRAMUSCULAR | Status: AC
  Administered 2020-01-09: 15 mg via INTRAVENOUS

## 2020-01-09 MED ORDER — KETOROLAC TROMETHAMINE 60 MG/2ML IM SOLN
30.0000 mg | Freq: Once | INTRAMUSCULAR | Status: DC
  Filled 2020-01-09: qty 2

## 2020-01-09 NOTE — ED Provider Notes (Signed)
[facility] EMERGENCY DEPARTMENT Provider Note   CSN: [id number] Arrival date & time: 01/09/20  7989     History Chief Complaint  Patient presents with  . Cough    Theresa Galloway is a 54 y.o. female.  Presents to ER with concern for cough, congestion, headache.  Symptoms started 3 days ago with cough as well as mild sore throat.  Initially nonproductive but now mildly productive with yellow sputum.  No blood.  2 days ago started having dull achy headache.  Mild to moderate, not severe, not worst headache of her life.  No nausea or vomiting.  Last night noted loss of taste.  Still able to tolerate p.o.  She denies any difficulty in breathing or chest pain.  Reports history of hepatitis B, hypertension.  Works as a Neurosurgeon.  Denies Covid exposures.  HPI     Past Medical History:  Diagnosis Date  . Hepatitis B   . Hypertension     Patient Active Problem List   Diagnosis Date Noted  . Non-intractable vomiting with nausea   . Substance abuse (HCC)   . Intractable nausea and vomiting 05/22/2016  . Chest pain 05/22/2016  . Abdominal pain 05/22/2016  . Sepsis (HCC) 05/22/2016  . Hypokalemia 05/22/2016  . Cough 05/22/2016  . Flu-like symptoms 05/22/2016  . Hypertension     Past Surgical History:  Procedure Laterality Date  . ABDOMINAL HYSTERECTOMY    . CHOLECYSTECTOMY       OB History   No obstetric history on file.     Family History  Problem Relation Age of Onset  . Gastric cancer Mother   . Obesity Father   . Diabetes Mellitus II Brother   . Kidney disease Brother   . Congestive Heart Failure Brother     Social History   Tobacco Use  . Smoking status: Current Every Day Smoker    Packs/day: 0.50    Types: Cigarettes  . Smokeless tobacco: Never Used  Substance Use Topics  . Alcohol use: Yes    Comment: occ  . Drug use: Yes    Types: Marijuana    Home Medications Prior to Admission medications   Medication Sig Start Date End Date  Taking? Authorizing Provider  amLODipine (NORVASC) 10 MG tablet Take 1 tablet (10 mg total) by mouth daily. 05/25/16   Albertine Grates, MD  amLODipine (NORVASC) 10 MG tablet Take 1 tablet (10 mg total) by mouth daily. 03/07/17   Arby Barrette, MD  amoxicillin (AMOXIL) 500 MG tablet Take 2 tablets (1,000 mg total) by mouth 2 (two) times daily. 04/02/18   Arby Barrette, MD  aspirin EC 81 MG tablet Take 1 tablet (81 mg total) by mouth daily. 05/24/16   Albertine Grates, MD  ibuprofen (ADVIL,MOTRIN) 600 MG tablet Take 1 tablet (600 mg total) by mouth every 8 (eight) hours as needed. 04/02/18   Arby Barrette, MD  lisinopril (PRINIVIL,ZESTRIL) 40 MG tablet Take 1 tablet (40 mg total) by mouth daily. 05/25/16   Albertine Grates, MD  lisinopril (PRINIVIL,ZESTRIL) 40 MG tablet Take 1 tablet (40 mg total) by mouth daily. 03/07/17   Arby Barrette, MD  metoprolol tartrate (LOPRESSOR) 25 MG tablet Take 25 mg by mouth 2 (two) times daily.    [provider]  metoprolol tartrate (LOPRESSOR) 25 MG tablet Take 1 tablet (25 mg total) by mouth 2 (two) times daily. 03/07/17   Arby Barrette, MD  metroNIDAZOLE (FLAGYL) 500 MG tablet Take 1 tablet (500 mg total) by  mouth 2 (two) times daily. One po bid x 7 days 03/07/17   Arby Barrette, MD    Allergies    Patient has no known allergies.  Review of Systems   Review of Systems  Constitutional: Positive for chills and fatigue. Negative for fever.  HENT: Negative for ear pain and sore throat.   Eyes: Negative for pain and visual disturbance.  Respiratory: Positive for cough. Negative for shortness of breath.   Cardiovascular: Negative for chest pain and palpitations.  Gastrointestinal: Negative for abdominal pain and vomiting.  Genitourinary: Negative for dysuria and hematuria.  Musculoskeletal: Positive for arthralgias and myalgias. Negative for back pain.  Skin: Negative for color change and rash.  Neurological: Negative for seizures and syncope.  All other systems  reviewed and are negative.   Physical Exam Updated Vital Signs BP (!) 167/92 (BP Location: Right Arm)   Pulse 66   Temp 98.4 F (36.9 C) (Oral)   Resp 16   Ht 5\' 3"  (1.6 m)   Wt 68 kg   SpO2 100%   BMI 26.57 kg/m   Physical Exam Vitals and nursing note reviewed.  Constitutional:      General: She is not in acute distress.    Appearance: She is well-developed.  HENT:     Head: Normocephalic and atraumatic.  Eyes:     Conjunctiva/sclera: Conjunctivae normal.  Cardiovascular:     Rate and Rhythm: Normal rate and regular rhythm.     Heart sounds: No murmur heard.   Pulmonary:     Effort: Pulmonary effort is normal. No respiratory distress.     Breath sounds: Normal breath sounds.  Abdominal:     Palpations: Abdomen is soft.     Tenderness: There is no abdominal tenderness.  Musculoskeletal:     Cervical back: Normal range of motion and neck supple. No rigidity.  Skin:    General: Skin is warm and dry.  Neurological:     General: No focal deficit present.     Mental Status: She is alert and oriented to person, place, and time.  Psychiatric:        Mood and Affect: Mood normal.        Behavior: Behavior normal.     ED Results / Procedures / Treatments   Labs (all labs ordered are listed, but only abnormal results are displayed) Labs Reviewed  CBC WITH DIFFERENTIAL/PLATELET - Abnormal; Notable for the following components:      Result Value   WBC 17.8 (*)    RBC 5.39 (*)    Neutro Abs 14.1 (*)    All other components within normal limits  BASIC METABOLIC PANEL - Abnormal; Notable for the following components:   Glucose, Bld 116 (*)    All other components within normal limits  RESPIRATORY PANEL BY RT PCR (FLU A&B, COVID)    EKG None  Radiology DG Chest Portable 1 View  Result Date: 01/09/2020 CLINICAL DATA:  Cough; shortness of breath EXAM: PORTABLE CHEST 1 VIEW COMPARISON:  October 04, 2016 FINDINGS: Lungs are clear. Heart is upper normal in size with  pulmonary vascularity normal. No adenopathy. No bone lesions. IMPRESSION: Lungs clear.  Heart upper normal in size. Electronically Signed   By: October 06, 2016 III M.D.   On: 01/09/2020 08:42    Procedures Procedures (including critical care time)  Medications Ordered in ED Medications  ketorolac (TORADOL) 15 MG/ML injection 15 mg (15 mg Intravenous Given 01/09/20 0911)    ED Course  I  have reviewed the triage vital signs and the nursing notes.  Pertinent labs & imaging results that were available during my care of the patient were reviewed by me and considered in my medical decision making (see chart for details).    MDM Rules/Calculators/A&P                          54 year old lady presents to ER with concern for cough, congestion, body aches, headaches and loss of taste.  On exam patient noted to be remarkably well-appearing in no distress, her vital signs are stable.  Her lungs are clear.  No urinary symptoms, no GI symptoms.  CXR negative for pneumonia.  CBC, BMP within normal limits except noted leukocytosis. ased on symptomatology suspect acute viral illness.   At this time recommend supportive care and follow-up with primary.  Reviewed return precautions with patient and discharged home.    After the discussed management above, the patient was determined to be safe for discharge.  The patient was in agreement with this plan and all questions regarding their care were answered.  ED return precautions were discussed and the patient will return to the ED with any significant worsening of condition.   Final Clinical Impression(s) / ED Diagnoses Final diagnoses:  Acute viral syndrome  Leukocytosis, unspecified type    Rx / DC Orders ED Discharge Orders    None       Milagros Loll, MD 01/10/20 [phone]

## 2020-01-09 NOTE — ED Triage Notes (Signed)
Cough, sore throat , nasal congestion x 3 days. Headache.

## 2020-01-09 NOTE — ED Notes (Signed)
Pt refused to wait in the lobby,  This RN and other staff spoke to pt x3 , pt not wearing mask appropriatly, walked to vending machine ,  Siting and eating outside the ED lobby.

## 2020-01-09 NOTE — Discharge Instructions (Addendum)
Please follow-up with your primary care doctor in 24 to 48 hours regarding your symptoms from today.  If you do not have a primary care doctor, would recommend recheck in ER in 48 hours, return here.  If in the meantime, you develop difficulty breathing, neck stiffness, vomiting, fevers, other new concerning symptom, return to ER for reevaluation.  Recommend Tylenol or Motrin as needed for body aches, chills, fevers.

## 2020-08-28 ENCOUNTER — Encounter (HOSPITAL_BASED_OUTPATIENT_CLINIC_OR_DEPARTMENT_OTHER): Payer: Self-pay | Admitting: Emergency Medicine

## 2020-08-28 ENCOUNTER — Emergency Department (HOSPITAL_BASED_OUTPATIENT_CLINIC_OR_DEPARTMENT_OTHER): Payer: Self-pay

## 2020-08-28 ENCOUNTER — Other Ambulatory Visit: Payer: Self-pay

## 2020-08-28 ENCOUNTER — Emergency Department (HOSPITAL_BASED_OUTPATIENT_CLINIC_OR_DEPARTMENT_OTHER)
Admission: EM | Admit: 2020-08-28 | Discharge: 2020-08-28 | Disposition: A | Discharge status: Home / Self Care | Payer: Self-pay | Attending: Emergency Medicine | Admitting: Emergency Medicine

## 2020-08-28 DIAGNOSIS — F17210 Nicotine dependence, cigarettes, uncomplicated: Secondary | ICD-10-CM | POA: Insufficient documentation

## 2020-08-28 DIAGNOSIS — M542 Cervicalgia: Secondary | ICD-10-CM | POA: Insufficient documentation

## 2020-08-28 DIAGNOSIS — H6121 Impacted cerumen, right ear: Secondary | ICD-10-CM | POA: Insufficient documentation

## 2020-08-28 DIAGNOSIS — I10 Essential (primary) hypertension: Secondary | ICD-10-CM | POA: Insufficient documentation

## 2020-08-28 DIAGNOSIS — N390 Urinary tract infection, site not specified: Secondary | ICD-10-CM

## 2020-08-28 DIAGNOSIS — J029 Acute pharyngitis, unspecified: Secondary | ICD-10-CM | POA: Insufficient documentation

## 2020-08-28 DIAGNOSIS — Z28310 Unvaccinated for covid-19: Secondary | ICD-10-CM | POA: Insufficient documentation

## 2020-08-28 DIAGNOSIS — B9689 Other specified bacterial agents as the cause of diseases classified elsewhere: Secondary | ICD-10-CM | POA: Insufficient documentation

## 2020-08-28 DIAGNOSIS — F1410 Cocaine abuse, uncomplicated: Secondary | ICD-10-CM | POA: Insufficient documentation

## 2020-08-28 DIAGNOSIS — F1010 Alcohol abuse, uncomplicated: Secondary | ICD-10-CM | POA: Insufficient documentation

## 2020-08-28 DIAGNOSIS — Z7982 Long term (current) use of aspirin: Secondary | ICD-10-CM | POA: Insufficient documentation

## 2020-08-28 DIAGNOSIS — R509 Fever, unspecified: Secondary | ICD-10-CM | POA: Insufficient documentation

## 2020-08-28 DIAGNOSIS — Z20822 Contact with and (suspected) exposure to covid-19: Secondary | ICD-10-CM | POA: Insufficient documentation

## 2020-08-28 DIAGNOSIS — R5383 Other fatigue: Secondary | ICD-10-CM | POA: Insufficient documentation

## 2020-08-28 DIAGNOSIS — R059 Cough, unspecified: Secondary | ICD-10-CM

## 2020-08-28 DIAGNOSIS — M791 Myalgia, unspecified site: Secondary | ICD-10-CM

## 2020-08-28 DIAGNOSIS — Z79899 Other long term (current) drug therapy: Secondary | ICD-10-CM | POA: Insufficient documentation

## 2020-08-28 LAB — COMPREHENSIVE METABOLIC PANEL
ALT: 15 U/L (ref 0–44)
AST: 16 U/L (ref 15–41)
Albumin: 3.5 g/dL (ref 3.5–5.0)
Alkaline Phosphatase: 82 U/L (ref 38–126)
Anion gap: 7 (ref 5–15)
BUN: 14 mg/dL (ref 6–20)
CO2: 21 mmol/L — ABNORMAL LOW (ref 22–32)
Calcium: 8.7 mg/dL — ABNORMAL LOW (ref 8.9–10.3)
Chloride: 112 mmol/L — ABNORMAL HIGH (ref 98–111)
Creatinine, Ser: 0.64 mg/dL (ref 0.44–1.00)
GFR, Estimated: >60 mL/min (ref >60)
Glucose, Bld: 133 mg/dL — ABNORMAL HIGH (ref 70–99)
Potassium: 3.7 mmol/L (ref 3.5–5.1)
Sodium: 140 mmol/L (ref 135–145)
Total Bilirubin: 0.3 mg/dL (ref 0.3–1.2)
Total Protein: 6.4 g/dL — ABNORMAL LOW (ref 6.5–8.1)

## 2020-08-28 LAB — CBC WITH DIFFERENTIAL/PLATELET
Abs Immature Granulocytes: 0.03 10*3/uL (ref 0.00–0.07)
Basophils Absolute: 0.1 10*3/uL (ref 0.0–0.1)
Basophils Relative: 1 %
Eosinophils Absolute: 0.3 10*3/uL (ref 0.0–0.5)
Eosinophils Relative: 3 %
HCT: 40.8 % (ref 36.0–46.0)
Hemoglobin: 13.5 g/dL (ref 12.0–15.0)
Immature Granulocytes: 0 %
Lymphocytes Relative: 48 %
Lymphs Abs: 5.2 10*3/uL — ABNORMAL HIGH (ref 0.7–4.0)
MCH: 27.4 pg (ref 26.0–34.0)
MCHC: 33.1 g/dL (ref 30.0–36.0)
MCV: 82.8 fL (ref 80.0–100.0)
Monocytes Absolute: 0.6 10*3/uL (ref 0.1–1.0)
Monocytes Relative: 6 %
Neutro Abs: 4.6 10*3/uL (ref 1.7–7.7)
Neutrophils Relative %: 42 %
Platelets: 279 10*3/uL (ref 150–400)
RBC: 4.93 MIL/uL (ref 3.87–5.11)
RDW: 14.4 % (ref 11.5–15.5)
WBC: 10.8 10*3/uL — ABNORMAL HIGH (ref 4.0–10.5)
nRBC: 0 % (ref 0.0–0.2)

## 2020-08-28 LAB — RESP PANEL BY RT-PCR (FLU A&B, COVID) ARPGX2
Influenza A by PCR: NEGATIVE
Influenza B by PCR: NEGATIVE
SARS Coronavirus 2 by RT PCR: NEGATIVE

## 2020-08-28 LAB — URINALYSIS, ROUTINE W REFLEX MICROSCOPIC
Bilirubin Urine: NEGATIVE
Glucose, UA: NEGATIVE mg/dL
Hgb urine dipstick: NEGATIVE
Ketones, ur: NEGATIVE mg/dL
Nitrite: POSITIVE — AB
Protein, ur: NEGATIVE mg/dL
Specific Gravity, Urine: 1.025 (ref 1.005–1.030)
pH: 6 (ref 5.0–8.0)

## 2020-08-28 LAB — URINALYSIS, MICROSCOPIC (REFLEX)

## 2020-08-28 MED ORDER — CEPHALEXIN 500 MG PO CAPS: 500.0000 mg | ORAL_CAPSULE | Freq: Three times a day (TID) | ORAL | 0 refills | Status: AC

## 2020-08-28 MED ORDER — CYCLOBENZAPRINE HCL 10 MG PO TABS
10.0000 mg | ORAL_TABLET | Freq: Once | ORAL | Status: AC
  Administered 2020-08-28: 10 mg via ORAL
  Filled 2020-08-28: qty 1

## 2020-08-28 MED ORDER — CYCLOBENZAPRINE HCL 10 MG PO TABS: 10.0000 mg | ORAL_TABLET | Freq: Two times a day (BID) | ORAL | 0 refills | Status: AC | PRN

## 2020-08-28 MED ORDER — ACETAMINOPHEN 325 MG PO TABS
650.0000 mg | ORAL_TABLET | Freq: Once | ORAL | Status: AC
  Administered 2020-08-28: 650 mg via ORAL
  Filled 2020-08-28: qty 2

## 2020-08-28 MED ORDER — SODIUM CHLORIDE 0.9 % IV SOLN: INTRAVENOUS | Status: DC | PRN

## 2020-08-28 MED ORDER — AMLODIPINE BESYLATE 5 MG PO TABS
10.0000 mg | ORAL_TABLET | Freq: Once | ORAL | Status: AC
  Administered 2020-08-28: 10 mg via ORAL
  Filled 2020-08-28: qty 2

## 2020-08-28 MED ORDER — FLUCONAZOLE 150 MG PO TABS: 150.0000 mg | ORAL_TABLET | Freq: Once | ORAL | 0 refills | Status: AC

## 2020-08-28 MED ORDER — SODIUM CHLORIDE 0.9 % IV SOLN
1.0000 g | Freq: Once | INTRAVENOUS | Status: AC
  Administered 2020-08-28: 1 g via INTRAVENOUS
  Filled 2020-08-28: qty 10

## 2020-08-28 NOTE — ED Notes (Signed)
In to speak with client, stated she was tired of waiting, "I waited over to get something to drink" explained to pt that her nurse was busy in another room, due to an emergency, she stated that did not matter, stated she has called for her ride, she removed all of the cardiac monitor devices, climbed over stretcher rails and got dressed. Encouraged pt to take medication prescribed, ED MD in to speak with client as well. Pt has agreed to take all medications prescribed.

## 2020-08-28 NOTE — ED Notes (Signed)
Pt getting a portable xray and will get EKG after they are done with Pt.

## 2020-08-28 NOTE — ED Triage Notes (Signed)
Cough and  Rt sided neck and ear pain x 1 week no vaccine

## 2020-08-28 NOTE — ED Provider Notes (Signed)
[facility] EMERGENCY DEPARTMENT Provider Note   CSN: [id number] Arrival date & time: 08/28/20  8099     History Chief Complaint  Patient presents with  . Cough    Theresa Galloway is a 55 y.o. female.  The history is provided by the patient and medical records.   Theresa Galloway is a 55 y.o. female who presents to the Emergency Department complaining of body aches and neck pain.  Sxs started one week ago.  She has generalized body aches, fatigue.  Has associated productive cough, sore throat and right ear pain.  She has significant pain to the right lateral neck.  Had fever yesterday.    No chest pain, sob, abdominal, N/V/D, dysuria, numbness/weakness.    Has a hx/o HTN, did not take meds today - misses them frequently.    Smokes tobacco, occasional alcohol use, occasional cocaine use, last use was last weekend.  No known sick contacts.  Has not been vaccinated for COVID 19.       Past Medical History:  Diagnosis Date  . Hepatitis B   . Hypertension     Patient Active Problem List   Diagnosis Date Noted  . Non-intractable vomiting with nausea   . Substance abuse (HCC)   . Intractable nausea and vomiting 05/22/2016  . Chest pain 05/22/2016  . Abdominal pain 05/22/2016  . Sepsis (HCC) 05/22/2016  . Hypokalemia 05/22/2016  . Cough 05/22/2016  . Flu-like symptoms 05/22/2016  . Hypertension     Past Surgical History:  Procedure Laterality Date  . ABDOMINAL HYSTERECTOMY    . CHOLECYSTECTOMY       OB History   No obstetric history on file.     Family History  Problem Relation Age of Onset  . Gastric cancer Mother   . Obesity Father   . Diabetes Mellitus II Brother   . Kidney disease Brother   . Congestive Heart Failure Brother     Social History   Tobacco Use  . Smoking status: Current Every Day Smoker    Packs/day: 0.50    Types: Cigarettes  . Smokeless tobacco: Never Used  Substance Use Topics  . Alcohol use: Yes    Comment: occ  . Drug  use: Yes    Types: Marijuana    Home Medications Prior to Admission medications   Medication Sig Start Date End Date Taking? Authorizing Provider  amLODipine (NORVASC) 10 MG tablet Take 1 tablet (10 mg total) by mouth daily. 05/25/16  Yes Albertine Grates, MD  amLODipine (NORVASC) 10 MG tablet Take 1 tablet (10 mg total) by mouth daily. 03/07/17  Yes Arby Barrette, MD  aspirin EC 81 MG tablet Take 1 tablet (81 mg total) by mouth daily. 05/24/16  Yes Albertine Grates, MD  cephALEXin (KEFLEX) 500 MG capsule Take 1 capsule (500 mg total) by mouth 3 (three) times daily. 08/28/20  Yes Tilden Fossa, MD  cyclobenzaprine (FLEXERIL) 10 MG tablet Take 1 tablet (10 mg total) by mouth 2 (two) times daily as needed for muscle spasms. 08/28/20  Yes Tilden Fossa, MD  fluconazole (DIFLUCAN) 150 MG tablet Take 1 tablet (150 mg total) by mouth once for 1 dose. 08/28/20 08/28/20 Yes Tilden Fossa, MD  ibuprofen (ADVIL,MOTRIN) 600 MG tablet Take 1 tablet (600 mg total) by mouth every 8 (eight) hours as needed. 04/02/18  Yes Arby Barrette, MD  lisinopril (PRINIVIL,ZESTRIL) 40 MG tablet Take 1 tablet (40 mg total) by mouth daily. 03/07/17  Yes Arby Barrette, MD  metoprolol  tartrate (LOPRESSOR) 25 MG tablet Take 25 mg by mouth 2 (two) times daily.   Yes [provider]  metoprolol tartrate (LOPRESSOR) 25 MG tablet Take 1 tablet (25 mg total) by mouth 2 (two) times daily. 03/07/17  Yes Arby BarrettePfeiffer, Marcy, MD  amoxicillin (AMOXIL) 500 MG tablet Take 2 tablets (1,000 mg total) by mouth 2 (two) times daily. 04/02/18   Arby BarrettePfeiffer, Marcy, MD  lisinopril (PRINIVIL,ZESTRIL) 40 MG tablet Take 1 tablet (40 mg total) by mouth daily. 05/25/16   Albertine GratesXu, Fang, MD  metroNIDAZOLE (FLAGYL) 500 MG tablet Take 1 tablet (500 mg total) by mouth 2 (two) times daily. One po bid x 7 days 03/07/17   Arby BarrettePfeiffer, Marcy, MD    Allergies    Patient has no known allergies.  Review of Systems   Review of Systems  All other systems reviewed and are  negative.   Physical Exam Updated Vital Signs BP (!) 193/110 (BP Location: Right Arm)   Pulse 65   Temp 98.5 F (36.9 C) (Oral)   Resp 20   Ht 5\' 3"  (1.6 m)   Wt 68 kg   SpO2 100%   BMI 26.57 kg/m   Physical Exam Vitals and nursing note reviewed.  Constitutional:      Appearance: She is well-developed.  HENT:     Head: Normocephalic and atraumatic.     Ears:     Comments: Right TM is obscured by cerumen, left TM within normal limits. Posterior oropharynx without significant erythema, edema    Mouth/Throat:     Mouth: Mucous membranes are moist.  Neck:     Comments: Tenderness to palpation over the right trapezius and right lateral neck without any local swelling or erythema Cardiovascular:     Rate and Rhythm: Normal rate and regular rhythm.     Heart sounds: No murmur heard.   Pulmonary:     Effort: Pulmonary effort is normal. No respiratory distress.     Breath sounds: Normal breath sounds.  Abdominal:     Palpations: Abdomen is soft.     Tenderness: There is no abdominal tenderness. There is no guarding or rebound.  Musculoskeletal:        General: No swelling.     Cervical back: Neck supple.     Comments: Tender to palpation on the left calf without local swelling.  Skin:    General: Skin is warm and dry.  Neurological:     Mental Status: She is alert and oriented to person, place, and time.  Psychiatric:        Behavior: Behavior normal.     ED Results / Procedures / Treatments   Labs (all labs ordered are listed, but only abnormal results are displayed) Labs Reviewed  COMPREHENSIVE METABOLIC PANEL - Abnormal; Notable for the following components:      Result Value   Chloride 112 (*)    CO2 21 (*)    Glucose, Bld 133 (*)    Calcium 8.7 (*)    Total Protein 6.4 (*)    All other components within normal limits  CBC WITH DIFFERENTIAL/PLATELET - Abnormal; Notable for the following components:   WBC 10.8 (*)    Lymphs Abs 5.2 (*)    All other  components within normal limits  URINALYSIS, ROUTINE W REFLEX MICROSCOPIC - Abnormal; Notable for the following components:   APPearance CLOUDY (*)    Nitrite POSITIVE (*)    Leukocytes,Ua TRACE (*)    All other components within normal limits  URINALYSIS, MICROSCOPIC (REFLEX) - Abnormal; Notable for the following components:   Bacteria, UA MANY (*)    All other components within normal limits  RESP PANEL BY RT-PCR (FLU A&B, COVID) ARPGX2  TROPONIN I (HIGH SENSITIVITY)    EKG EKG Interpretation  Date/Time:  Thursday Aug 28 2020 09:30:24 EDT Ventricular Rate:  62 PR Interval:  174 QRS Duration: 97 QT Interval:  484 QTC Calculation: 492 R Axis:   9 Text Interpretation: Sinus rhythm Anterior infarct, old Confirmed by Tilden Fossa [phone]) on 08/28/2020 9:34:27 AM   Radiology US Venous Img Lower Unilateral Left  Result Date: 08/28/2020 CLINICAL DATA:  55 year old female with left calf pain. EXAM: LEFT LOWER EXTREMITY VENOUS DOPPLER ULTRASOUND TECHNIQUE: Gray-scale sonography with graded compression, as well as color Doppler and duplex ultrasound were performed to evaluate the left lower extremity deep venous systems from the level of the common femoral vein and including the common femoral, femoral, profunda femoral, popliteal and calf veins including the posterior tibial, peroneal and gastrocnemius veins when visible. Spectral Doppler was utilized to evaluate flow at rest and with distal augmentation maneuvers in the common femoral, femoral and popliteal veins. The contralateral common femoral vein was also evaluated for comparison. COMPARISON:  None. FINDINGS: LEFT LOWER EXTREMITY Common Femoral Vein: No evidence of thrombus. Normal compressibility, respiratory phasicity and response to augmentation. Central Greater Saphenous Vein: No evidence of thrombus. Normal compressibility and flow on color Doppler imaging. Central Profunda Femoral Vein: No evidence of thrombus. Normal  compressibility and flow on color Doppler imaging. Femoral Vein: No evidence of thrombus. Normal compressibility, respiratory phasicity and response to augmentation. Popliteal Vein: No evidence of thrombus. Normal compressibility, respiratory phasicity and response to augmentation. Calf Veins: No evidence of thrombus. Normal compressibility and flow on color Doppler imaging. Other Findings:  None. RIGHT LOWER EXTREMITY Common Femoral Vein: No evidence of thrombus. Normal compressibility, respiratory phasicity and response to augmentation. IMPRESSION: No evidence of left lower extremity deep venous thrombosis. Marliss Coots, MD [facility] Radiology Electronically Signed   By: Marliss Coots MD   On: 08/28/2020 10:51   DG Chest Portable 1 View  Result Date: 08/28/2020 CLINICAL DATA:  Cough EXAM: PORTABLE CHEST 1 VIEW COMPARISON:  01/09/2020 FINDINGS: Mild cardiomegaly. Both lungs are clear. The visualized skeletal structures are unremarkable. IMPRESSION: Mild cardiomegaly without acute abnormality of the lungs in AP portable projection. Electronically Signed   By: Lauralyn Primes M.D.   On: 08/28/2020 09:50    Procedures Procedures   Medications Ordered in ED Medications  0.9 %  sodium chloride infusion ( Intravenous Stopped 08/28/20 1247)  amLODipine (NORVASC) tablet 10 mg (10 mg Oral Given 08/28/20 1149)  acetaminophen (TYLENOL) tablet 650 mg (650 mg Oral Given 08/28/20 1148)  cyclobenzaprine (FLEXERIL) tablet 10 mg (10 mg Oral Given 08/28/20 1149)  cefTRIAXone (ROCEPHIN) 1 g in sodium chloride 0.9 % 100 mL IVPB (0 g Intravenous Stopped 08/28/20 1228)    ED Course  I have reviewed the triage vital signs and the nursing notes.  Pertinent labs & imaging results that were available during my care of the patient were reviewed by me and considered in my medical decision making (see chart for details).    MDM Rules/Calculators/A&P                          patient here for evaluation of body aches, cough, neck pain. She had a fever yesterday. She is non-toxic appearing  on evaluation. She does have muscle tenderness to palpation over the trapezius and right lateral neck without evidence of mass or fullness. UA is concerning for UTI, will start on antibiotics in setting of her symptoms. No evidence of acute kidney injury, pneumonia, rhabdomyolysis. Presentation is not consistent with PE, ACS, dissection. Discussed with patient home care for myalgia, UTI. Discussed outpatient follow-up and return precautions.  Discussed with patient recommendation to comply with home blood pressure medications and avoid cocaine use.  Final Clinical Impression(s) / ED Diagnoses Final diagnoses:  Myalgia  Cough  Acute UTI    Rx / DC Orders ED Discharge Orders         Ordered    cyclobenzaprine (FLEXERIL) 10 MG tablet  2 times daily PRN        08/28/20 1113    cephALEXin (KEFLEX) 500 MG capsule  3 times daily        08/28/20 1139    fluconazole (DIFLUCAN) 150 MG tablet   Once        08/28/20 1202           Tilden Fossa, MD 08/28/20 1536

## 2020-08-28 NOTE — ED Notes (Signed)
Pt given two warm blankets and RN Casimiro Needle spoke with Pt about their delay.

## 2020-08-28 NOTE — ED Notes (Signed)
ED Provider at bedside. 

## 2021-01-10 ENCOUNTER — Emergency Department (HOSPITAL_BASED_OUTPATIENT_CLINIC_OR_DEPARTMENT_OTHER)
Admission: EM | Admit: 2021-01-10 | Discharge: 2021-01-10 | Disposition: A | Discharge status: Home / Self Care | Payer: Medicaid Other | Attending: Emergency Medicine | Admitting: Emergency Medicine

## 2021-01-10 ENCOUNTER — Other Ambulatory Visit: Payer: Self-pay

## 2021-01-10 ENCOUNTER — Encounter (HOSPITAL_BASED_OUTPATIENT_CLINIC_OR_DEPARTMENT_OTHER): Payer: Self-pay | Admitting: Emergency Medicine

## 2021-01-10 DIAGNOSIS — N898 Other specified noninflammatory disorders of vagina: Secondary | ICD-10-CM | POA: Insufficient documentation

## 2021-01-10 DIAGNOSIS — Z5321 Procedure and treatment not carried out due to patient leaving prior to being seen by health care provider: Secondary | ICD-10-CM | POA: Insufficient documentation

## 2021-01-10 LAB — URINALYSIS, ROUTINE W REFLEX MICROSCOPIC
Bilirubin Urine: NEGATIVE
Glucose, UA: NEGATIVE mg/dL
Ketones, ur: NEGATIVE mg/dL
Leukocytes,Ua: NEGATIVE
Nitrite: NEGATIVE
Protein, ur: NEGATIVE mg/dL
Specific Gravity, Urine: 1.03 (ref 1.005–1.030)
pH: 6 (ref 5.0–8.0)

## 2021-01-10 LAB — URINALYSIS, MICROSCOPIC (REFLEX)

## 2021-01-10 NOTE — ED Triage Notes (Signed)
Pt c/o vaginal itching x 1 wk; denies discharge, but states there is now a foul odor

## 2023-01-03 IMAGING — DX DG CHEST 1V PORT
1 series · 1 of 1 positions shown · non-contrast
Comparison: 01/09/2020

CLINICAL DATA: Cough

EXAM:
PORTABLE CHEST 1 VIEW

[chest ap]
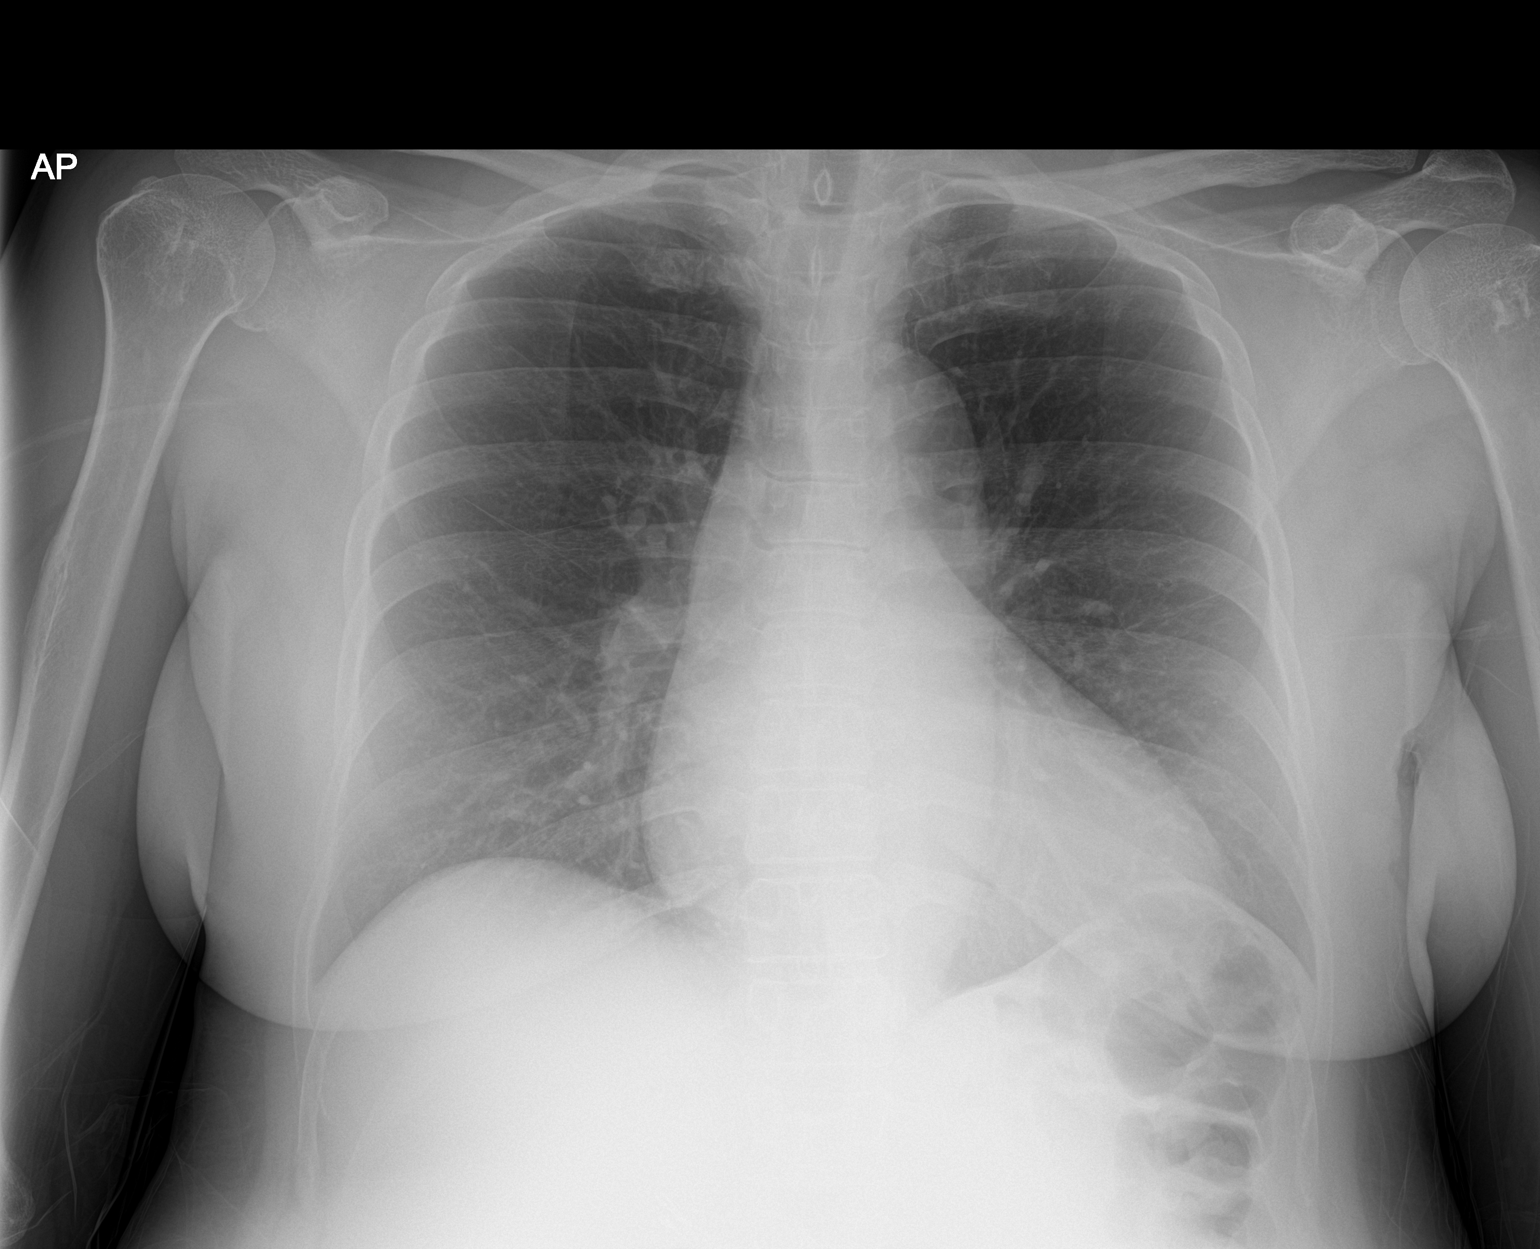

[1 of 1 positions shown; findings below may reference images not displayed]

FINDINGS: Mild cardiomegaly. Both lungs are clear. The visualized skeletal
structures are unremarkable.
IMPRESSION: Mild cardiomegaly without acute abnormality of the lungs in AP
portable projection.
